# Patient Record
Sex: Female | Born: 1945 | Hispanic: No | Marital: Single
Health system: Southern US, Community
[De-identification: ages and names within clinical notes are randomized; demographics above are authoritative.]

## PROBLEM LIST (undated history)

## (undated) DIAGNOSIS — F419 Anxiety disorder, unspecified: Secondary | ICD-10-CM

## (undated) DIAGNOSIS — N12 Tubulo-interstitial nephritis, not specified as acute or chronic: Secondary | ICD-10-CM

## (undated) DIAGNOSIS — D219 Benign neoplasm of connective and other soft tissue, unspecified: Secondary | ICD-10-CM

## (undated) DIAGNOSIS — K5900 Constipation, unspecified: Secondary | ICD-10-CM

## (undated) DIAGNOSIS — B999 Unspecified infectious disease: Secondary | ICD-10-CM

## (undated) DIAGNOSIS — T148XXA Other injury of unspecified body region, initial encounter: Secondary | ICD-10-CM

## (undated) HISTORY — DX: Hypercalcemia: E83.52

## (undated) HISTORY — PX: PARATHYROIDECTOMY: SHX19

---

## 1998-07-09 ENCOUNTER — Ambulatory Visit (HOSPITAL_COMMUNITY): Admission: RE | Admit: 1998-07-09 | Discharge: 1998-07-09 | Payer: Self-pay | Admitting: *Deleted

## 1999-08-01 ENCOUNTER — Encounter (INDEPENDENT_AMBULATORY_CARE_PROVIDER_SITE_OTHER): Payer: Self-pay

## 1999-08-01 ENCOUNTER — Ambulatory Visit (HOSPITAL_COMMUNITY): Admission: RE | Admit: 1999-08-01 | Discharge: 1999-08-01 | Payer: Self-pay | Admitting: Obstetrics and Gynecology

## 2001-03-16 ENCOUNTER — Encounter: Payer: Self-pay | Admitting: Family Medicine

## 2001-03-16 ENCOUNTER — Ambulatory Visit (HOSPITAL_COMMUNITY): Admission: RE | Admit: 2001-03-16 | Discharge: 2001-03-16 | Payer: Self-pay | Admitting: Family Medicine

## 2002-09-20 ENCOUNTER — Encounter: Payer: Self-pay | Admitting: Internal Medicine

## 2002-09-20 ENCOUNTER — Ambulatory Visit (HOSPITAL_COMMUNITY): Admission: RE | Admit: 2002-09-20 | Discharge: 2002-09-20 | Payer: Self-pay | Admitting: Internal Medicine

## 2002-12-26 ENCOUNTER — Ambulatory Visit (HOSPITAL_COMMUNITY): Admission: RE | Admit: 2002-12-26 | Discharge: 2002-12-26 | Payer: Self-pay

## 2003-01-25 ENCOUNTER — Ambulatory Visit (HOSPITAL_COMMUNITY): Admission: RE | Admit: 2003-01-25 | Discharge: 2003-01-26 | Payer: Self-pay

## 2003-01-25 ENCOUNTER — Encounter (INDEPENDENT_AMBULATORY_CARE_PROVIDER_SITE_OTHER): Payer: Self-pay | Admitting: *Deleted

## 2003-05-23 ENCOUNTER — Ambulatory Visit (HOSPITAL_COMMUNITY): Admission: RE | Admit: 2003-05-23 | Discharge: 2003-05-23 | Payer: Self-pay

## 2003-07-06 ENCOUNTER — Ambulatory Visit (HOSPITAL_COMMUNITY): Admission: RE | Admit: 2003-07-06 | Discharge: 2003-07-06 | Payer: Self-pay | Admitting: Internal Medicine

## 2004-06-14 ENCOUNTER — Ambulatory Visit: Payer: Self-pay | Admitting: Family Medicine

## 2004-06-18 ENCOUNTER — Ambulatory Visit: Payer: Self-pay | Admitting: *Deleted

## 2004-12-30 ENCOUNTER — Ambulatory Visit: Payer: Self-pay | Admitting: Internal Medicine

## 2004-12-30 ENCOUNTER — Ambulatory Visit: Payer: Self-pay | Admitting: Family Medicine

## 2005-02-17 ENCOUNTER — Ambulatory Visit: Payer: Self-pay | Admitting: Family Medicine

## 2005-02-21 ENCOUNTER — Ambulatory Visit (HOSPITAL_COMMUNITY): Admission: RE | Admit: 2005-02-21 | Discharge: 2005-02-21 | Payer: Self-pay | Admitting: Family Medicine

## 2005-06-30 ENCOUNTER — Ambulatory Visit: Payer: Self-pay | Admitting: Nurse Practitioner

## 2005-07-14 ENCOUNTER — Ambulatory Visit: Payer: Self-pay | Admitting: Family Medicine

## 2006-01-29 ENCOUNTER — Ambulatory Visit: Payer: Self-pay | Admitting: Family Medicine

## 2006-02-02 ENCOUNTER — Ambulatory Visit (HOSPITAL_COMMUNITY): Admission: RE | Admit: 2006-02-02 | Discharge: 2006-02-02 | Payer: Self-pay | Admitting: Family Medicine

## 2006-02-25 ENCOUNTER — Ambulatory Visit (HOSPITAL_COMMUNITY): Admission: RE | Admit: 2006-02-25 | Discharge: 2006-02-25 | Payer: Self-pay | Admitting: Family Medicine

## 2006-02-25 ENCOUNTER — Ambulatory Visit: Payer: Self-pay | Admitting: Family Medicine

## 2006-03-23 ENCOUNTER — Ambulatory Visit: Payer: Self-pay | Admitting: Family Medicine

## 2006-03-26 ENCOUNTER — Ambulatory Visit (HOSPITAL_COMMUNITY): Admission: RE | Admit: 2006-03-26 | Discharge: 2006-03-26 | Payer: Self-pay | Admitting: Family Medicine

## 2006-04-06 ENCOUNTER — Ambulatory Visit: Payer: Self-pay | Admitting: Family Medicine

## 2006-05-05 ENCOUNTER — Ambulatory Visit: Payer: Self-pay | Admitting: Family Medicine

## 2006-05-11 ENCOUNTER — Ambulatory Visit: Payer: Self-pay | Admitting: Family Medicine

## 2006-06-02 ENCOUNTER — Ambulatory Visit: Payer: Self-pay | Admitting: Family Medicine

## 2006-06-30 ENCOUNTER — Ambulatory Visit: Payer: Self-pay | Admitting: Family Medicine

## 2006-07-31 ENCOUNTER — Ambulatory Visit: Payer: Self-pay | Admitting: Family Medicine

## 2006-11-06 ENCOUNTER — Ambulatory Visit: Payer: Self-pay | Admitting: Internal Medicine

## 2006-12-08 ENCOUNTER — Ambulatory Visit (HOSPITAL_COMMUNITY): Admission: RE | Admit: 2006-12-08 | Discharge: 2006-12-08 | Payer: Self-pay | Admitting: Family Medicine

## 2006-12-08 ENCOUNTER — Ambulatory Visit: Payer: Self-pay | Admitting: Family Medicine

## 2007-02-05 ENCOUNTER — Ambulatory Visit: Payer: Self-pay | Admitting: Family Medicine

## 2007-03-01 ENCOUNTER — Ambulatory Visit: Payer: Self-pay | Admitting: Family Medicine

## 2007-03-05 ENCOUNTER — Ambulatory Visit: Payer: Self-pay | Admitting: Family Medicine

## 2007-04-08 ENCOUNTER — Ambulatory Visit: Payer: Self-pay | Admitting: Family Medicine

## 2007-04-25 ENCOUNTER — Emergency Department (HOSPITAL_COMMUNITY): Admission: EM | Admit: 2007-04-25 | Discharge: 2007-04-25 | Payer: Self-pay | Admitting: Emergency Medicine

## 2007-06-16 ENCOUNTER — Encounter (INDEPENDENT_AMBULATORY_CARE_PROVIDER_SITE_OTHER): Payer: Self-pay | Admitting: *Deleted

## 2007-07-12 ENCOUNTER — Ambulatory Visit: Payer: Self-pay | Admitting: Family Medicine

## 2007-07-12 ENCOUNTER — Encounter (INDEPENDENT_AMBULATORY_CARE_PROVIDER_SITE_OTHER): Payer: Self-pay | Admitting: Family Medicine

## 2007-09-27 ENCOUNTER — Ambulatory Visit: Payer: Self-pay | Admitting: Family Medicine

## 2007-11-16 ENCOUNTER — Ambulatory Visit: Payer: Self-pay | Admitting: Family Medicine

## 2008-03-14 ENCOUNTER — Ambulatory Visit: Payer: Self-pay | Admitting: Internal Medicine

## 2008-04-13 ENCOUNTER — Ambulatory Visit: Payer: Self-pay | Admitting: Family Medicine

## 2008-05-10 ENCOUNTER — Ambulatory Visit: Payer: Self-pay | Admitting: Family Medicine

## 2008-05-10 LAB — CONVERTED CEMR LAB
ALT: 13 U/L (ref 0–35)
AST: 17 U/L (ref 0–37)
Albumin: 4.5 g/dL (ref 3.5–5.2)
Alkaline Phosphatase: 95 U/L (ref 39–117)
BUN: 12 mg/dL (ref 6–23)
CO2: 24 meq/L (ref 19–32)
Calcium: 11 mg/dL — ABNORMAL HIGH (ref 8.4–10.5)
Chloride: 108 meq/L (ref 96–112)
Cholesterol: 194 mg/dL (ref 0–200)
Creatinine, Ser: 0.73 mg/dL (ref 0.40–1.20)
Glucose, Bld: 101 mg/dL — ABNORMAL HIGH (ref 70–99)
HDL: 59 mg/dL (ref >39)
LDL Cholesterol: 122 mg/dL — ABNORMAL HIGH (ref 0–99)
Potassium: 4.5 meq/L (ref 3.5–5.3)
Sodium: 142 meq/L (ref 135–145)
Total Bilirubin: 0.4 mg/dL (ref 0.3–1.2)
Total CHOL/HDL Ratio: 3.3
Total Protein: 7.4 g/dL (ref 6.0–8.3)
Triglycerides: 63 mg/dL (ref <150)
VLDL: 13 mg/dL (ref 0–40)

## 2008-07-14 ENCOUNTER — Ambulatory Visit: Payer: Self-pay | Admitting: Family Medicine

## 2008-07-14 ENCOUNTER — Encounter (INDEPENDENT_AMBULATORY_CARE_PROVIDER_SITE_OTHER): Payer: Self-pay | Admitting: Family Medicine

## 2008-07-31 ENCOUNTER — Ambulatory Visit (HOSPITAL_COMMUNITY): Admission: RE | Admit: 2008-07-31 | Discharge: 2008-07-31 | Payer: Self-pay | Admitting: Family Medicine

## 2008-08-03 ENCOUNTER — Ambulatory Visit: Payer: Self-pay | Admitting: Family Medicine

## 2008-08-03 LAB — CONVERTED CEMR LAB
BUN: 7 mg/dL (ref 6–23)
CO2: 25 meq/L (ref 19–32)
Calcium: 11.7 mg/dL — ABNORMAL HIGH (ref 8.4–10.5)
Chloride: 106 meq/L (ref 96–112)
Creatinine, Ser: 0.77 mg/dL (ref 0.40–1.20)
Glucose, Bld: 103 mg/dL — ABNORMAL HIGH (ref 70–99)
Potassium: 4.8 meq/L (ref 3.5–5.3)
Sodium: 142 meq/L (ref 135–145)
TSH: 1.406 u[IU]/mL (ref 0.350–4.50)
Vit D, 1,25-Dihydroxy: 11 — ABNORMAL LOW (ref 30–89)

## 2008-10-05 ENCOUNTER — Ambulatory Visit: Payer: Self-pay | Admitting: Family Medicine

## 2008-11-20 ENCOUNTER — Ambulatory Visit: Payer: Self-pay | Admitting: Family Medicine

## 2008-11-21 ENCOUNTER — Ambulatory Visit: Payer: Self-pay | Admitting: Family Medicine

## 2008-11-21 LAB — CONVERTED CEMR LAB
BUN: 9 mg/dL (ref 6–23)
CO2: 25 meq/L (ref 19–32)
Calcium: 11.3 mg/dL — ABNORMAL HIGH (ref 8.4–10.5)
Chloride: 104 meq/L (ref 96–112)
Creatinine, Ser: 0.61 mg/dL (ref 0.40–1.20)
Glucose, Bld: 109 mg/dL — ABNORMAL HIGH (ref 70–99)
Potassium: 4.3 meq/L (ref 3.5–5.3)
Sodium: 140 meq/L (ref 135–145)
Vit D, 25-Hydroxy: 29 ng/mL — ABNORMAL LOW (ref 30–89)

## 2008-12-08 ENCOUNTER — Ambulatory Visit: Payer: Self-pay | Admitting: Family Medicine

## 2009-05-04 ENCOUNTER — Ambulatory Visit: Payer: Self-pay | Admitting: Family Medicine

## 2009-05-04 ENCOUNTER — Encounter (INDEPENDENT_AMBULATORY_CARE_PROVIDER_SITE_OTHER): Payer: Self-pay | Admitting: Family Medicine

## 2009-05-04 ENCOUNTER — Other Ambulatory Visit: Admission: RE | Admit: 2009-05-04 | Discharge: 2009-05-04 | Payer: Self-pay | Admitting: Family Medicine

## 2009-05-04 LAB — CONVERTED CEMR LAB
BUN: 12 mg/dL (ref 6–23)
CO2: 25 meq/L (ref 19–32)
Calcium: 11.4 mg/dL — ABNORMAL HIGH (ref 8.4–10.5)
Chloride: 107 meq/L (ref 96–112)
Creatinine, Ser: 0.75 mg/dL (ref 0.40–1.20)
Glucose, Bld: 98 mg/dL (ref 70–99)
Potassium: 4.5 meq/L (ref 3.5–5.3)
Sodium: 141 meq/L (ref 135–145)
Vitamin B-12: 1064 pg/mL — ABNORMAL HIGH (ref 211–911)

## 2009-05-21 ENCOUNTER — Other Ambulatory Visit: Admission: RE | Admit: 2009-05-21 | Discharge: 2009-05-21 | Payer: Self-pay | Admitting: Family Medicine

## 2009-05-21 ENCOUNTER — Encounter (INDEPENDENT_AMBULATORY_CARE_PROVIDER_SITE_OTHER): Payer: Self-pay | Admitting: Family Medicine

## 2009-05-21 ENCOUNTER — Ambulatory Visit: Payer: Self-pay | Admitting: Family Medicine

## 2009-08-01 ENCOUNTER — Ambulatory Visit (HOSPITAL_COMMUNITY): Admission: RE | Admit: 2009-08-01 | Discharge: 2009-08-01 | Payer: Self-pay | Admitting: Family Medicine

## 2009-09-18 ENCOUNTER — Ambulatory Visit: Payer: Self-pay | Admitting: Internal Medicine

## 2009-10-08 ENCOUNTER — Ambulatory Visit: Payer: Self-pay | Admitting: Internal Medicine

## 2010-05-31 ENCOUNTER — Ambulatory Visit: Payer: Self-pay | Admitting: Internal Medicine

## 2010-05-31 LAB — CONVERTED CEMR LAB
Cholesterol: 185 mg/dL (ref 0–200)
Direct LDL: 119 mg/dL — ABNORMAL HIGH
HDL: 61 mg/dL (ref >39)
LDL Cholesterol: 103 mg/dL — ABNORMAL HIGH (ref 0–99)
Total CHOL/HDL Ratio: 3
Triglycerides: 105 mg/dL (ref <150)
VLDL: 21 mg/dL (ref 0–40)

## 2010-06-05 ENCOUNTER — Ambulatory Visit (HOSPITAL_COMMUNITY): Admission: RE | Admit: 2010-06-05 | Discharge: 2010-06-05 | Payer: Self-pay | Admitting: Family Medicine

## 2010-07-12 ENCOUNTER — Ambulatory Visit: Payer: Self-pay | Admitting: Internal Medicine

## 2010-08-09 ENCOUNTER — Ambulatory Visit (HOSPITAL_COMMUNITY)
Admission: RE | Admit: 2010-08-09 | Discharge: 2010-08-09 | Payer: Self-pay | Source: Home / Self Care | Admitting: Internal Medicine

## 2010-09-24 ENCOUNTER — Encounter (INDEPENDENT_AMBULATORY_CARE_PROVIDER_SITE_OTHER): Payer: Self-pay | Admitting: Family Medicine

## 2010-09-24 LAB — CONVERTED CEMR LAB
ALT: 9 U/L (ref 0–35)
AST: 17 U/L (ref 0–37)
Albumin: 4.7 g/dL (ref 3.5–5.2)
Alkaline Phosphatase: 106 U/L (ref 39–117)
BUN: 10 mg/dL (ref 6–23)
CO2: 24 meq/L (ref 19–32)
Calcium, Total (PTH): 11.3 mg/dL — ABNORMAL HIGH (ref 8.4–10.5)
Calcium: 11.3 mg/dL — ABNORMAL HIGH (ref 8.4–10.5)
Chloride: 106 meq/L (ref 96–112)
Creatinine, Ser: 0.59 mg/dL (ref 0.40–1.20)
Glucose, Bld: 95 mg/dL (ref 70–99)
PTH: 198.6 pg/mL — ABNORMAL HIGH (ref 14.0–72.0)
Potassium: 4.6 meq/L (ref 3.5–5.3)
Sodium: 142 meq/L (ref 135–145)
Total Bilirubin: 0.4 mg/dL (ref 0.3–1.2)
Total Protein: 7.7 g/dL (ref 6.0–8.3)

## 2010-10-20 ENCOUNTER — Encounter: Payer: Self-pay | Admitting: Family Medicine

## 2010-10-29 NOTE — Miscellaneous (Signed)
 Summary: VIP  Patient: Amy Craig Note: All result statuses are Final unless otherwise noted.  Tests: (1) VIP (Medications)   LLIMPORTMEDS              Result Below...       RESULT: PROTONIX TBEC 40 MG*TOMAR UNA TABLETA POR VIA ORAL CADA DIA (TAKE 1 TABLET BY MOUTH ONCE A DAY)*07/31/2006*Last Refill: Lwxwntw*[id number]*******   LLIMPORTMEDS              Result Below...       RESULT: PREMPRO TABS 0.625-5 MG*TOMAR UNA TABLETA DIARIA (TAKE ONE TABLET ONCE A DAY)*03/16/2007*Last Refill: Lwxwntw*[id number]*******   LLIMPORTMEDS              Result Below...       RESULT: PHENAZOPYRIDINE HCL TABS 100 MG*TOMAR 2 TABLETAS 3 VECES AL DIA CON LAS COMIDAS) (TAKE 2 TABLETS 3 TIMES A DAY WITH MEALS)*03/01/2007*Last Refill: [email or web address]*******   LLIMPORTMEDS              Result Below...       RESULT: HYDROCORTISONE VALERATE CREA 0.2 %*APLICAR AL AREA AFECTADA UNA VEZ AL DIA (APPLY TO AFFECTED AREA ONCE DAILY)*06/30/2006*Last Refill: [email or web address]*******   LLIMPORTMEDS              Result Below...       RESULT: FUROSEMIDE TABS 40 MG*TOMA MITAD PASTILLA CADA MANANA (TAKE 1/2 TABLET EACH MORNING)*03/16/2007*Last Refill: [email or web address]*******   LLIMPORTMEDS              Result Below...       RESULT: CIPROFLOXACIN HCL TABS 250 MG*TOMAR UNA TABLETA DOS VECES AL DIA (TAKE ONE TABLET TWO TIMES A DAY).*12/08/2006*Last Refill: Lwxwntw*[id number]*******   LLIMPORTALLS              NKDA***  Note: An exclamation mark (!) indicates a result that was not dispersed into the flowsheet. Document Creation Date: 07/29/2007 3:05 PM _______________________________________________________________________  (1) Order result status: Final Collection or observation date-time: 06/16/2007 Requested date-time: 06/16/2007 Receipt date-time:  Reported date-time: 06/16/2007 Referring Physician:   Ordering Physician:   Specimen Source:  Source: Amy Craig Order Number:  Lab site:

## 2010-11-28 ENCOUNTER — Other Ambulatory Visit: Payer: Self-pay | Admitting: Surgery

## 2010-11-28 ENCOUNTER — Other Ambulatory Visit (HOSPITAL_COMMUNITY): Payer: Self-pay | Admitting: Surgery

## 2010-11-28 ENCOUNTER — Encounter (HOSPITAL_COMMUNITY): Payer: Self-pay

## 2010-11-28 ENCOUNTER — Ambulatory Visit (HOSPITAL_COMMUNITY)
Admission: RE | Admit: 2010-11-28 | Discharge: 2010-11-28 | Disposition: A | Discharge status: Home / Self Care | Payer: Self-pay | Source: Ambulatory Visit | Attending: Surgery | Admitting: Surgery

## 2010-11-28 DIAGNOSIS — Z01810 Encounter for preprocedural cardiovascular examination: Secondary | ICD-10-CM | POA: Insufficient documentation

## 2010-11-28 DIAGNOSIS — Z01812 Encounter for preprocedural laboratory examination: Secondary | ICD-10-CM | POA: Insufficient documentation

## 2010-11-28 DIAGNOSIS — M479 Spondylosis, unspecified: Secondary | ICD-10-CM | POA: Insufficient documentation

## 2010-11-28 DIAGNOSIS — R9431 Abnormal electrocardiogram [ECG] [EKG]: Secondary | ICD-10-CM | POA: Insufficient documentation

## 2010-11-28 DIAGNOSIS — E210 Primary hyperparathyroidism: Secondary | ICD-10-CM | POA: Insufficient documentation

## 2010-11-28 DIAGNOSIS — Z01818 Encounter for other preprocedural examination: Secondary | ICD-10-CM | POA: Insufficient documentation

## 2010-11-28 LAB — DIFFERENTIAL
Basophils Absolute: 0 K/uL (ref 0.0–0.1)
Basophils Relative: 1 % (ref 0–1)
Eosinophils Absolute: 0.1 K/uL (ref 0.0–0.7)
Eosinophils Relative: 2 % (ref 0–5)
Lymphocytes Relative: 42 % (ref 12–46)
Lymphs Abs: 2.5 K/uL (ref 0.7–4.0)
Monocytes Absolute: 0.5 K/uL (ref 0.1–1.0)
Monocytes Relative: 8 % (ref 3–12)
Neutro Abs: 2.8 K/uL (ref 1.7–7.7)
Neutrophils Relative %: 47 % (ref 43–77)

## 2010-11-28 LAB — CBC
HCT: 42 % (ref 36.0–46.0)
Hemoglobin: 13.8 g/dL (ref 12.0–15.0)
MCH: 30.5 pg (ref 26.0–34.0)
MCHC: 32.9 g/dL (ref 30.0–36.0)
MCV: 92.7 fL (ref 78.0–100.0)
Platelets: 299 K/uL (ref 150–400)
RBC: 4.53 MIL/uL (ref 3.87–5.11)
RDW: 12.6 % (ref 11.5–15.5)
WBC: 5.9 K/uL (ref 4.0–10.5)

## 2010-11-28 LAB — URINALYSIS, ROUTINE W REFLEX MICROSCOPIC
Bilirubin Urine: NEGATIVE
Ketones, ur: NEGATIVE mg/dL
Nitrite: POSITIVE — AB
Protein, ur: NEGATIVE mg/dL
Specific Gravity, Urine: 1.024 (ref 1.005–1.030)
Urine Glucose, Fasting: NEGATIVE mg/dL
Urobilinogen, UA: 0.2 mg/dL (ref 0.0–1.0)
pH: 6 (ref 5.0–8.0)

## 2010-11-28 LAB — SURGICAL PCR SCREEN
MRSA, PCR: NEGATIVE
Staphylococcus aureus: NEGATIVE

## 2010-11-28 LAB — URINE MICROSCOPIC-ADD ON

## 2010-11-28 LAB — BASIC METABOLIC PANEL WITH GFR
BUN: 9 mg/dL (ref 6–23)
CO2: 28 meq/L (ref 19–32)
Calcium: 11.4 mg/dL — ABNORMAL HIGH (ref 8.4–10.5)
Chloride: 109 meq/L (ref 96–112)
Creatinine, Ser: 0.61 mg/dL (ref 0.4–1.2)
GFR calc Af Amer: >60 mL/min (ref >60)
GFR calc non Af Amer: >60 mL/min (ref >60)
Glucose, Bld: 107 mg/dL — ABNORMAL HIGH (ref 70–99)
Potassium: 4.2 meq/L (ref 3.5–5.1)
Sodium: 142 meq/L (ref 135–145)

## 2010-11-28 LAB — PROTIME-INR
INR: 1.05 (ref 0.00–1.49)
Prothrombin Time: 13.9 s (ref 11.6–15.2)

## 2010-12-03 ENCOUNTER — Ambulatory Visit (HOSPITAL_COMMUNITY)
Admission: RE | Admit: 2010-12-03 | Discharge: 2010-12-04 | Disposition: A | Discharge status: Home / Self Care | Payer: Self-pay | Source: Ambulatory Visit | Attending: Surgery | Admitting: Surgery

## 2010-12-03 ENCOUNTER — Other Ambulatory Visit: Payer: Self-pay | Admitting: Surgery

## 2010-12-03 DIAGNOSIS — Z01818 Encounter for other preprocedural examination: Secondary | ICD-10-CM | POA: Insufficient documentation

## 2010-12-03 DIAGNOSIS — Z01810 Encounter for preprocedural cardiovascular examination: Secondary | ICD-10-CM | POA: Insufficient documentation

## 2010-12-03 DIAGNOSIS — Z01812 Encounter for preprocedural laboratory examination: Secondary | ICD-10-CM | POA: Insufficient documentation

## 2010-12-03 DIAGNOSIS — E210 Primary hyperparathyroidism: Secondary | ICD-10-CM | POA: Insufficient documentation

## 2010-12-03 DIAGNOSIS — R9431 Abnormal electrocardiogram [ECG] [EKG]: Secondary | ICD-10-CM | POA: Insufficient documentation

## 2010-12-03 LAB — POCT I-STAT, CHEM 8
BUN: <3 mg/dL — ABNORMAL LOW (ref 6–23)
Calcium, Ion: 1.5 mmol/L — ABNORMAL HIGH (ref 1.12–1.32)
Chloride: 104 meq/L (ref 96–112)
Creatinine, Ser: 0.7 mg/dL (ref 0.4–1.2)
Glucose, Bld: 99 mg/dL (ref 70–99)
HCT: 43 % (ref 36.0–46.0)
Hemoglobin: 14.6 g/dL (ref 12.0–15.0)
Potassium: 4.8 meq/L (ref 3.5–5.1)
Sodium: 141 meq/L (ref 135–145)
TCO2: 27 mmol/L (ref 0–100)

## 2010-12-03 LAB — CALCIUM: Calcium: 9.2 mg/dL (ref 8.4–10.5)

## 2010-12-04 LAB — CALCIUM: Calcium: 8.7 mg/dL (ref 8.4–10.5)

## 2010-12-06 NOTE — Discharge Summary (Signed)
  Amy Craig, Amy Craig      ACCOUNT NO.:  [id number]  MEDICAL RECORD NO.:  [id number]           PATIENT TYPE:  O  LOCATION:  1538                         FACILITY:  [facility]  PHYSICIAN:  Velora Heckler, MD      DATE OF BIRTH:  May 29, 1946  DATE OF ADMISSION:  12/03/2010 DATE OF DISCHARGE:  12/04/2010                              DISCHARGE SUMMARY   REASON FOR ADMISSION:  Recurrent primary hyperparathyroidism.  BRIEF HISTORY:  The patient is a 65 year old Hispanic female with recurrent primary hyperparathyroidism.  The patient had undergone minimally invasive parathyroidectomy by Dr. Lebron Conners in 2004. Unfortunately, her calcium levels never normalized postoperatively.  She has been followed intermittently in [place] and in [place], [place].  Recent studies in 2011 in [place], [place] showed a persistently elevated PTH level and elevated serum calcium level. 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  The patient is now admitted for resection.  HOSPITAL COURSE:  The patient was admitted on the surgical service and taken directly to the operating room.  She underwent right neck exploration and resection of a right superior parathyroid adenoma. Postoperative course was uncomplicated.  Calcium levels fell to 9.2 on the evening of surgery and to 8.7 on the morning following her procedure.  She tolerated a diet.  She was prepared for discharge.  DISCHARGE PLAN:  The patient is discharged home today, December 04, 2010, in good condition, tolerating a regular diet, and ambulating independently. The patient will be seen back in my office at [facility] in 2 weeks.  Discharge medications include Vicodin as needed for pain.  FINAL DIAGNOSIS:  Recurrent primary hyperparathyroidism, final pathologic results pending at the time of discharge.  CONDITION ON DISCHARGE:  Good.     Velora Heckler, MD     TMG/MEDQ  D:   12/04/2010  T:  12/04/2010  Job:  [id number]  cc:   Velora Heckler, MD [address] [place] [address]  Sharlyne Pacas, M.D. [facility]  Electronically Signed by Darnell Level MD on 12/05/2010 11:42:12 AM

## 2010-12-06 NOTE — Op Note (Signed)
Amy Craig, Amy Craig      ACCOUNT NO.:  [id number]  MEDICAL RECORD NO.:  [id number]           PATIENT TYPE:  O  LOCATION:  DAYL                         FACILITY:  [facility]  PHYSICIAN:  Velora Heckler, MD      DATE OF BIRTH:  12-26-1945  DATE OF PROCEDURE:  12/03/2010 DATE OF DISCHARGE:                              OPERATIVE REPORT   PREOPERATIVE DIAGNOSIS:  Recurrent primary hyperparathyroidism.  POSTOPERATIVE DIAGNOSIS:  Recurrent primary hyperparathyroidism.  PROCEDURE:  Right superior minimally invasive parathyroidectomy.  SURGEON:  Velora Heckler, MD, FACS  ANESTHESIA:  General per Jill Side, M.D.  ESTIMATED BLOOD LOSS:  Minimal.  PREPARATION:  ChloraPrep.  COMPLICATIONS:  None.  INDICATIONS:  The patient is a 65 year old Hispanic female who presents with recurrent primary hyperparathyroidism.  The patient had undergone minimally invasive parathyroidectomy by Dr. Lebron Conners in 2004. However, calcium levels failed to normalize.  The patient has moved between [place] and [place], [place].  In [place], [place], she was noted to have persistently elevated serum calcium level and an elevated intact PTH level of 135.  She underwent a nuclear medicine sestamibi scan in May 2011 localizing a parathyroid adenoma to the right neck posterior to the right lobe of the thyroid gland consistent with right superior parathyroid adenoma.  The patient now comes to surgery for neck exploration and resection.  BODY OF REPORT:  Procedure was done in OR #6 at the [facility].  The patient was brought to the operating room, placed in a supine position on the operating room table.  Following administration of general anesthesia, the patient was positioned and then prepped and draped in the usual strict aseptic fashion.  After ascertaining that an adequate level of anesthesia had been achieved, a mid right neck incision was made with #15 blade.   Dissection was carried through subcutaneous tissues and platysma.  Weitlaner retractor was placed for exposure.  Strap muscles were incised in the midline and reflected to the right exposing the right thyroid lobe.  There was some scar tissue due to previous dissection.  Right lobe was gently mobilized.  Dissection was carried posterior to the right thyroid lobe. Superior pole vessels were left intact.  Branches of the inferior thyroid artery were divided between small Ligaclips with the electrocautery.  Posterior to the right lobe was a nodular density lying alongside the lateral edge of the esophagus.  This was gently mobilized. Dissection was difficult due to scar tissue.  Vascular structures were divided between small Ligaclips.  Nodule was completely mobilized and finally excised in its entirety.  It was submitted fresh to Pathology where frozen section by Dr. Jimmy Picket confirmed parathyroid tissue consistent with parathyroid adenoma.  Neck was irrigated with saline.  Good hemostasis was noted.  Surgicel was placed in the operative field.  Strap muscles were reapproximated in the midline with interrupted 3-0 Vicryl sutures.  Platysma was closed with interrupted 3-0 Vicryl sutures.  Skin was closed with running 4-0 Monocryl subcuticular suture.  Wound was washed and dried and Benzoin and Steri-Strips were applied.  Sterile dressings were applied.  The patient was awakened from anesthesia and brought to the  recovery room. The patient tolerated the procedure well.   Velora Heckler, MD, FACS     TMG/MEDQ  D:  12/03/2010  T:  12/03/2010  Job:  [id number]  cc:   Dr. Sharlyne Pacas  Electronically Signed by Darnell Level MD on 12/05/2010 11:42:07 AM

## 2010-12-07 ENCOUNTER — Emergency Department (HOSPITAL_COMMUNITY)
Admission: EM | Admit: 2010-12-07 | Discharge: 2010-12-07 | Disposition: A | Discharge status: Home / Self Care | Payer: Self-pay | Attending: Emergency Medicine | Admitting: Emergency Medicine

## 2010-12-07 DIAGNOSIS — X58XXXA Exposure to other specified factors, initial encounter: Secondary | ICD-10-CM | POA: Insufficient documentation

## 2010-12-07 DIAGNOSIS — E215 Disorder of parathyroid gland, unspecified: Secondary | ICD-10-CM | POA: Insufficient documentation

## 2010-12-07 DIAGNOSIS — T148XXA Other injury of unspecified body region, initial encounter: Secondary | ICD-10-CM | POA: Insufficient documentation

## 2010-12-07 DIAGNOSIS — M542 Cervicalgia: Secondary | ICD-10-CM | POA: Insufficient documentation

## 2011-02-14 NOTE — Op Note (Signed)
   [name], Amy Craig                          ACCOUNT NO.:  [id number]   MEDICAL RECORD NO.:  [id number]                   PATIENT TYPE:  OIB   LOCATION:  2864                                 FACILITY:  [facility]   PHYSICIAN:  Lorre Munroe., M.D.            DATE OF BIRTH:  Jan 18, 1946   DATE OF PROCEDURE:  01/25/2003  DATE OF DISCHARGE:                                 OPERATIVE REPORT   PREOPERATIVE DIAGNOSIS:  Right inferior parathyroid adenoma.   POSTOPERATIVE DIAGNOSIS:  Right inferior parathyroid adenoma.   OPERATION/PROCEDURE:  Minimally invasive resection of parathyroid adenoma.   SURGEON:  Lebron Conners, M.D.   ASSISTANT:  Vikki Ports, M.D.   ANESTHESIA:  General.   DESCRIPTION OF PROCEDURE:  After the patient was monitored and anesthetized,  I used the probe to locate a hot spot near the right inferior part of the  thyroid gland as expected because of the Sestamibi scan.  I then prepped and  draped the neck as a sterile field and made a short transverse incision over  the area of maximum radioactivity and divided the platysma, got hemostasis,  retracted the sternocleidomastoid laterally, and divided the medial strap  muscles to expose the inferior pole of the parathyroid and the area around  it.  I dissected down around the inferior parathyroid artery.  No tumor was  immediately obvious.  I kept my dissection medial to the carotid and using  the maximal radioactivity as a guide, I located a mass behind the inferior  pole of  the thyroid gland.  It was very lobulated.  I excised it and sent  it for frozen section.  Then examining the tissue, felt that I had not  removed all of it perhaps.  Removed another tumor, which was in the exact  same location, slightly more posterior.  Frozen section diagnosis of  parathyroid tissue was returned on both specimens.  Hemostasis was obtained  with clips, suture ligatures, and cautery as necessary.  I briefly irrigated  the field and found hemostasis to be good.  I reapproximated the strap  muscles with interrupted figure-of-eight 4-0 Vicryl sutures, ran a 4-0  Vicryl suture in the platysma, ran another intracuticular 4-0 Vicryl suture  in the skin, and reinforced that with Steri-Strips.  The patient tolerated  the operation well.                                               Lorre Munroe., M.D.    Jodi Marble  D:  01/25/2003  T:  01/25/2003  Job:  [id number]

## 2011-03-17 ENCOUNTER — Encounter (INDEPENDENT_AMBULATORY_CARE_PROVIDER_SITE_OTHER): Payer: Self-pay | Admitting: Surgery

## 2011-03-17 DIAGNOSIS — Z803 Family history of malignant neoplasm of breast: Secondary | ICD-10-CM | POA: Insufficient documentation

## 2011-03-17 DIAGNOSIS — E079 Disorder of thyroid, unspecified: Secondary | ICD-10-CM | POA: Insufficient documentation

## 2011-03-17 DIAGNOSIS — M1990 Unspecified osteoarthritis, unspecified site: Secondary | ICD-10-CM | POA: Insufficient documentation

## 2011-03-31 ENCOUNTER — Other Ambulatory Visit: Payer: Self-pay | Admitting: Specialist

## 2011-03-31 ENCOUNTER — Ambulatory Visit
Admission: RE | Admit: 2011-03-31 | Discharge: 2011-03-31 | Disposition: A | Discharge status: Home / Self Care | Payer: No Typology Code available for payment source | Source: Ambulatory Visit | Attending: Specialist | Admitting: Specialist

## 2011-03-31 DIAGNOSIS — R6889 Other general symptoms and signs: Secondary | ICD-10-CM

## 2011-05-12 ENCOUNTER — Other Ambulatory Visit (HOSPITAL_COMMUNITY): Payer: Self-pay | Admitting: Chiropractic Medicine

## 2011-05-12 ENCOUNTER — Ambulatory Visit (HOSPITAL_COMMUNITY)
Admission: RE | Admit: 2011-05-12 | Discharge: 2011-05-12 | Disposition: A | Discharge status: Home / Self Care | Payer: Self-pay | Source: Ambulatory Visit | Attending: Chiropractic Medicine | Admitting: Chiropractic Medicine

## 2011-05-12 DIAGNOSIS — M545 Low back pain, unspecified: Secondary | ICD-10-CM | POA: Insufficient documentation

## 2011-05-12 DIAGNOSIS — M47814 Spondylosis without myelopathy or radiculopathy, thoracic region: Secondary | ICD-10-CM | POA: Insufficient documentation

## 2011-05-12 DIAGNOSIS — M25559 Pain in unspecified hip: Secondary | ICD-10-CM | POA: Insufficient documentation

## 2011-05-12 DIAGNOSIS — R52 Pain, unspecified: Secondary | ICD-10-CM

## 2011-05-12 DIAGNOSIS — M51379 Other intervertebral disc degeneration, lumbosacral region without mention of lumbar back pain or lower extremity pain: Secondary | ICD-10-CM | POA: Insufficient documentation

## 2011-05-12 DIAGNOSIS — M5137 Other intervertebral disc degeneration, lumbosacral region: Secondary | ICD-10-CM | POA: Insufficient documentation

## 2011-07-14 LAB — URINALYSIS, ROUTINE W REFLEX MICROSCOPIC
Bilirubin Urine: NEGATIVE
Glucose, UA: NEGATIVE
Hgb urine dipstick: NEGATIVE
Ketones, ur: 15 — AB
Nitrite: NEGATIVE
Protein, ur: NEGATIVE
Specific Gravity, Urine: 1.019
Urobilinogen, UA: 0.2
pH: 7

## 2011-07-14 LAB — COMPREHENSIVE METABOLIC PANEL WITH GFR
ALT: 12
AST: 16
Albumin: 4.1
Alkaline Phosphatase: 88
BUN: 6
CO2: 25
Calcium: 10.6 — ABNORMAL HIGH
Chloride: 107
Creatinine, Ser: 0.47
GFR calc Af Amer: >60
GFR calc non Af Amer: >60
Glucose, Bld: 111 — ABNORMAL HIGH
Potassium: 3.5
Sodium: 138
Total Bilirubin: 0.8
Total Protein: 6.9

## 2011-07-14 LAB — DIFFERENTIAL
Basophils Absolute: 0
Basophils Relative: 0
Eosinophils Absolute: 0
Eosinophils Relative: 0
Lymphocytes Relative: 17
Lymphs Abs: 1.4
Monocytes Absolute: 0.3
Monocytes Relative: 4
Neutro Abs: 6.6
Neutrophils Relative %: 79 — ABNORMAL HIGH

## 2011-07-14 LAB — CBC
HCT: 41.5
Hemoglobin: 13.9
MCHC: 33.6
MCV: 90.3
Platelets: 271
RBC: 4.59
RDW: 12.8
WBC: 8.4

## 2011-07-14 LAB — LIPASE, BLOOD: Lipase: 22

## 2011-09-04 ENCOUNTER — Other Ambulatory Visit: Payer: Self-pay | Admitting: Internal Medicine

## 2011-09-04 DIAGNOSIS — Z1231 Encounter for screening mammogram for malignant neoplasm of breast: Secondary | ICD-10-CM

## 2011-10-14 ENCOUNTER — Ambulatory Visit (HOSPITAL_COMMUNITY)
Admission: RE | Admit: 2011-10-14 | Discharge: 2011-10-14 | Disposition: A | Discharge status: Home / Self Care | Payer: Self-pay | Source: Ambulatory Visit | Attending: Internal Medicine | Admitting: Internal Medicine

## 2011-10-14 DIAGNOSIS — Z1231 Encounter for screening mammogram for malignant neoplasm of breast: Secondary | ICD-10-CM | POA: Insufficient documentation

## 2011-10-21 ENCOUNTER — Other Ambulatory Visit: Payer: Self-pay | Admitting: Internal Medicine

## 2011-10-21 DIAGNOSIS — R928 Other abnormal and inconclusive findings on diagnostic imaging of breast: Secondary | ICD-10-CM

## 2011-10-28 ENCOUNTER — Ambulatory Visit
Admission: RE | Admit: 2011-10-28 | Discharge: 2011-10-28 | Disposition: A | Discharge status: Home / Self Care | Payer: Self-pay | Source: Ambulatory Visit | Attending: Internal Medicine | Admitting: Internal Medicine

## 2011-10-28 DIAGNOSIS — R928 Other abnormal and inconclusive findings on diagnostic imaging of breast: Secondary | ICD-10-CM

## 2013-01-13 ENCOUNTER — Ambulatory Visit (INDEPENDENT_AMBULATORY_CARE_PROVIDER_SITE_OTHER): Payer: Self-pay | Admitting: Obstetrics & Gynecology

## 2013-01-13 ENCOUNTER — Encounter: Payer: Self-pay | Admitting: Obstetrics & Gynecology

## 2013-01-13 VITALS — BP 141/69 | HR 58 | Temp 97.0°F | Ht 60.0 in | Wt 146.4 lb

## 2013-01-13 DIAGNOSIS — N852 Hypertrophy of uterus: Secondary | ICD-10-CM

## 2013-01-13 DIAGNOSIS — Z01419 Encounter for gynecological examination (general) (routine) without abnormal findings: Secondary | ICD-10-CM

## 2013-01-13 DIAGNOSIS — Z0000 Encounter for general adult medical examination without abnormal findings: Secondary | ICD-10-CM

## 2013-01-13 NOTE — Progress Notes (Signed)
  Subjective:    Patient ID: Amy Craig, female    DOB: 05/28/1946, 67 y.o.   MRN: [id number]  HPI  67 yo Hispanic P7 who is here today because of chronic constipation. She was told that this is caused by an 8 cm uterine fibroid. This was based on an u/s done in [place]. She is abstinent. Her pap and mammogram are due.  Review of Systems     Objective:   Physical Exam  Normal breast exam  Vulva/vagina- with severe atrophy Cervix stenotic 12 week size uterus, non-palpable adnexa      Assessment & Plan:  Preventative care- pap and mammogram Enlarged uterus/probable fibroid- schedule gyn u/s

## 2013-01-25 ENCOUNTER — Ambulatory Visit (HOSPITAL_COMMUNITY)
Admission: RE | Admit: 2013-01-25 | Discharge: 2013-01-25 | Disposition: A | Discharge status: Home / Self Care | Payer: Self-pay | Source: Ambulatory Visit | Attending: Obstetrics & Gynecology | Admitting: Obstetrics & Gynecology

## 2013-01-25 ENCOUNTER — Ambulatory Visit (HOSPITAL_COMMUNITY): Payer: Self-pay

## 2013-01-25 DIAGNOSIS — N852 Hypertrophy of uterus: Secondary | ICD-10-CM | POA: Insufficient documentation

## 2013-01-25 DIAGNOSIS — Z780 Asymptomatic menopausal state: Secondary | ICD-10-CM | POA: Insufficient documentation

## 2013-01-25 DIAGNOSIS — D252 Subserosal leiomyoma of uterus: Secondary | ICD-10-CM | POA: Insufficient documentation

## 2013-02-03 ENCOUNTER — Other Ambulatory Visit: Payer: Self-pay | Admitting: Obstetrics & Gynecology

## 2013-02-03 ENCOUNTER — Encounter: Payer: Self-pay | Admitting: Obstetrics & Gynecology

## 2013-02-03 ENCOUNTER — Ambulatory Visit (INDEPENDENT_AMBULATORY_CARE_PROVIDER_SITE_OTHER): Payer: Self-pay | Admitting: Obstetrics & Gynecology

## 2013-02-03 ENCOUNTER — Ambulatory Visit (HOSPITAL_COMMUNITY)
Admission: RE | Admit: 2013-02-03 | Discharge: 2013-02-03 | Disposition: A | Discharge status: Home / Self Care | Payer: Self-pay | Source: Ambulatory Visit | Attending: Obstetrics & Gynecology | Admitting: Obstetrics & Gynecology

## 2013-02-03 VITALS — BP 134/60 | HR 55 | Temp 96.9°F | Ht 60.0 in | Wt 146.3 lb

## 2013-02-03 DIAGNOSIS — Z0000 Encounter for general adult medical examination without abnormal findings: Secondary | ICD-10-CM

## 2013-02-03 DIAGNOSIS — N852 Hypertrophy of uterus: Secondary | ICD-10-CM

## 2013-02-03 DIAGNOSIS — K5900 Constipation, unspecified: Secondary | ICD-10-CM

## 2013-02-03 NOTE — Progress Notes (Signed)
  Subjective:    Patient ID: Amy Craig, female    DOB: 11-03-1945, 67 y.o.   MRN: [id number]  HPI  67 yo S H P7 who is here for follow up after an u/s. Her major complaint is that of constipation for 10 years. She reports that she has a BM every 3-5 days. She takes flax seed daily. She has tried Miralax 1 time and "it did help".  Review of Systems     Objective:   Physical Exam  Mammogram normal Pap normal U/s shows fibroids      Assessment & Plan:  Constipation- Miralax daily If no relief, refer to GI I told her that if she wants a hysterectomy, then I would be happy to do it, but that I can't promise her that it would improve her bowel function.

## 2013-07-04 ENCOUNTER — Inpatient Hospital Stay (HOSPITAL_COMMUNITY)
Admission: AD | Admit: 2013-07-04 | Discharge: 2013-07-04 | Disposition: A | Discharge status: Home / Self Care | Payer: Self-pay | Source: Ambulatory Visit | Attending: Obstetrics & Gynecology | Admitting: Obstetrics & Gynecology

## 2013-07-04 ENCOUNTER — Encounter (HOSPITAL_COMMUNITY): Payer: Self-pay | Admitting: *Deleted

## 2013-07-04 DIAGNOSIS — K5900 Constipation, unspecified: Secondary | ICD-10-CM | POA: Insufficient documentation

## 2013-07-04 DIAGNOSIS — R300 Dysuria: Secondary | ICD-10-CM | POA: Insufficient documentation

## 2013-07-04 DIAGNOSIS — M79609 Pain in unspecified limb: Secondary | ICD-10-CM | POA: Insufficient documentation

## 2013-07-04 DIAGNOSIS — M25562 Pain in left knee: Secondary | ICD-10-CM

## 2013-07-04 DIAGNOSIS — N949 Unspecified condition associated with female genital organs and menstrual cycle: Secondary | ICD-10-CM | POA: Insufficient documentation

## 2013-07-04 DIAGNOSIS — B372 Candidiasis of skin and nail: Secondary | ICD-10-CM

## 2013-07-04 DIAGNOSIS — D259 Leiomyoma of uterus, unspecified: Secondary | ICD-10-CM | POA: Insufficient documentation

## 2013-07-04 DIAGNOSIS — M549 Dorsalgia, unspecified: Secondary | ICD-10-CM | POA: Insufficient documentation

## 2013-07-04 DIAGNOSIS — N950 Postmenopausal bleeding: Secondary | ICD-10-CM | POA: Insufficient documentation

## 2013-07-04 HISTORY — DX: Unspecified infectious disease: B99.9

## 2013-07-04 HISTORY — DX: Constipation, unspecified: K59.00

## 2013-07-04 HISTORY — DX: Benign neoplasm of connective and other soft tissue, unspecified: D21.9

## 2013-07-04 HISTORY — DX: Other injury of unspecified body region, initial encounter: T14.8XXA

## 2013-07-04 HISTORY — DX: Tubulo-interstitial nephritis, not specified as acute or chronic: N12

## 2013-07-04 HISTORY — DX: Anxiety disorder, unspecified: F41.9

## 2013-07-04 LAB — URINALYSIS, ROUTINE W REFLEX MICROSCOPIC
Bilirubin Urine: NEGATIVE
Glucose, UA: NEGATIVE mg/dL
Ketones, ur: NEGATIVE mg/dL
Nitrite: NEGATIVE
Protein, ur: NEGATIVE mg/dL
Specific Gravity, Urine: 1.02 (ref 1.005–1.030)
Urobilinogen, UA: 1 mg/dL (ref 0.0–1.0)
pH: 6 (ref 5.0–8.0)

## 2013-07-04 LAB — URINE MICROSCOPIC-ADD ON

## 2013-07-04 MED ORDER — TRIAMCINOLONE ACETONIDE 0.1 % EX CREA: TOPICAL_CREAM | Freq: Two times a day (BID) | CUTANEOUS | Status: AC

## 2013-07-04 MED ORDER — IBUPROFEN 800 MG PO TABS
800.0000 mg | ORAL_TABLET | Freq: Once | ORAL | Status: AC
  Administered 2013-07-04: 800 mg via ORAL
  Filled 2013-07-04: qty 1

## 2013-07-04 MED ORDER — CYCLOBENZAPRINE HCL 10 MG PO TABS
10.0000 mg | ORAL_TABLET | Freq: Once | ORAL | Status: AC
  Administered 2013-07-04: 10 mg via ORAL
  Filled 2013-07-04: qty 1

## 2013-07-04 MED ORDER — IBUPROFEN 800 MG PO TABS: 800.0000 mg | ORAL_TABLET | Freq: Three times a day (TID) | ORAL | Status: AC

## 2013-07-04 MED ORDER — NYSTATIN 100000 UNIT/GM EX CREA: TOPICAL_CREAM | CUTANEOUS | Status: AC

## 2013-07-04 MED ORDER — CYCLOBENZAPRINE HCL 10 MG PO TABS: 10.0000 mg | ORAL_TABLET | Freq: Two times a day (BID) | ORAL | Status: AC | PRN

## 2013-07-04 NOTE — MAU Note (Signed)
Hospital translator in for triage. Patient states she has been having pain with urination for 3-4 weeks and started having left flank pain today and pain in the left leg today. States she has had a brown vaginal discharge for a while and has taken a pill for it but it is still there.

## 2013-07-04 NOTE — MAU Note (Signed)
Started as pain with urination, burning/itching a few days ago.  Flank pain started yesterday, leg pain started last night

## 2013-07-04 NOTE — MAU Note (Signed)
aslo reports left breast is larger than right, noted change approx 2 yrs. Last mammogram was less than a year ago.

## 2013-07-04 NOTE — MAU Provider Note (Signed)
History     CSN: [id number]  Arrival date and time: 07/04/13 1541   First Provider Initiated Contact with Patient 07/04/13 1844      Chief Complaint  Patient presents with  . Dysuria  . Vaginal Discharge  . Leg Pain  . Flank Pain   HPI Ms. JEANNIFER DRAKEFORD is a 67 y.o. [id number] who presents to MAU today with multiple complaints. The patient state that she has had dysuria x 1 month, left lower back and left leg pain x months, irritation of the external genitalia x 2 months and a brown discharge x 6 months. The patient has known fibroids and has been evaluated in [facility] clinic previously. The patient states that she takes care of her MR daughter and grandchildren regularly and that the back pain is worse recently with ambulation and lifting. She denies unilateral swelling or bruising.   OB History   Grav Para Term Preterm Abortions TAB SAB Ect Mult Living   7 7 7       7       Past Medical History  Diagnosis Date  . Hypercalcemia   . Constipation   . Infection     UTI  . Pyelonephritis   . Fracture     rt ankle  . Fibroid   . Anxiety     with pregnancies    Past Surgical History  Procedure Laterality Date  . Parathyroidectomy      Ask patient to confirm. Detail not on history form dated 10/25/10.    Family History  Problem Relation Age of Onset  . Diabetes Father   . Cancer Brother     "head" noted on history form. Clarify w/patient.  . Alzheimer's disease Sister   . Diabetes Brother     History  Substance Use Topics  . Smoking status: Never Smoker   . Smokeless tobacco: Never Used  . Alcohol Use: Yes     Comment: rare    Allergies:  Allergies  Allergen Reactions  . Sulfa Antibiotics Rash    Confirm with patient if antibiotics or drug reactors she responds to.    Prescriptions prior to admission  Medication Sig Dispense Refill  . amitriptyline (ELAVIL) 25 MG tablet Take 25 mg by mouth as needed.        . Flaxseed, Linseed, (GNP FLAXSEED PO) Take 1  tablet by mouth daily.        Review of Systems  Constitutional: Negative for fever.  Gastrointestinal: Positive for abdominal pain and constipation. Negative for nausea, vomiting and diarrhea.  Genitourinary: Positive for dysuria. Negative for urgency, frequency and hematuria.       + vaginal bleeding Neg - vaginal discharge  Musculoskeletal: Positive for back pain and joint pain.   Physical Exam   Blood pressure 130/58, pulse 61, temperature 98.9 F (37.2 C), resp. rate 18, SpO2 100.00%.  Physical Exam  Constitutional: She is oriented to person, place, and time. She appears well-developed and well-nourished. No distress.  HENT:  Head: Normocephalic and atraumatic.  Cardiovascular: Normal rate, regular rhythm and normal heart sounds.   Respiratory: Effort normal and breath sounds normal. No respiratory distress.  GI: Soft. Bowel sounds are normal. She exhibits no distension and no mass. There is tenderness (mild tenderness to palpation of the LLQ). There is no rebound and no guarding.  Genitourinary: There is rash (mild erythema noted on the labia minora) on the right labia. There is rash on the left labia. No vaginal discharge found.  Musculoskeletal:  She exhibits tenderness. She exhibits no edema.       Arms:      Legs: Neurological: She is alert and oriented to person, place, and time.  Skin: Skin is warm and dry. No erythema.  Psychiatric: She has a normal mood and affect.   Results for orders placed during the hospital encounter of 07/04/13 (from the past 24 hour(s))  URINALYSIS, ROUTINE W REFLEX MICROSCOPIC     Status: Abnormal   Collection Time    07/04/13  4:40 PM      Result Value Range   Color, Urine YELLOW  YELLOW   APPearance CLEAR  CLEAR   Specific Gravity, Urine 1.020  1.005 - 1.030   pH 6.0  5.0 - 8.0   Glucose, UA NEGATIVE  NEGATIVE mg/dL   Hgb urine dipstick SMALL (*) NEGATIVE   Bilirubin Urine NEGATIVE  NEGATIVE   Ketones, ur NEGATIVE  NEGATIVE mg/dL    Protein, ur NEGATIVE  NEGATIVE mg/dL   Urobilinogen, UA 1.0  0.0 - 1.0 mg/dL   Nitrite NEGATIVE  NEGATIVE   Leukocytes, UA SMALL (*) NEGATIVE  URINE MICROSCOPIC-ADD ON     Status: None   Collection Time    07/04/13  4:40 PM      Result Value Range   Squamous Epithelial / LPF RARE  RARE   WBC, UA 7-10  <3 WBC/hpf   RBC / HPF 0-2  <3 RBC/hpf   Bacteria, UA RARE  RARE    MAU Course  Procedures None  MDM UA today Ibuprofen and Flexeril given in MAU - patient reports improvement in symtpoms Urine culture ordered  Assessment and Plan  A: Back pain, muscle strain Leg pain Post-Menopausal bleeding secondary to fibroids Contstipation  P: Discharge home Rx for Nystatin, Triamcinolone, Flexeril and Ibuprofen sent to patient's pharmacy Urine culture pending Discussed returning to clinic for further evaluation of brown spotting from fibroids Patient given list of resources in the community for follow-up care for there low back and leg pain Patient advised to go to [facility] or [facility] if her back and/or leg pain were to persist or worsen Patient may return to MAU as needed or if her condition were to change or worsen  Freddi Starr, PA-C  07/04/2013, 6:44 PM

## 2013-07-04 NOTE — MAU Provider Note (Signed)
Attestation of Attending Supervision of Advanced Practitioner (CNM/NP): Evaluation and management procedures were performed by the Advanced Practitioner under my supervision and collaboration.  I have reviewed the Advanced Practitioner's note and chart, and I agree with the management and plan.  HARRAWAY-SMITH, Delrose Rohwer 11:02 PM

## 2013-07-04 NOTE — Discharge Instructions (Signed)
[place] Resource Guide °(Revised August 2014) ° ° °Chronic Pain Problems:  °• [facility]:  [phone]  °         Patients need to be referred by their primary care doctor/specialist ° °Insufficient Money for Medicine:  °·         United Way: call "211"   °• MAP Program at [facility] [place] 641-8030 or [place] 641-7620 °          ° °No Primary Care Doctor:  °To locate a primary care doctor that accepts your insurance or provides certain services:  °·         [facility]: 832-8000  °·         Physician Referral Service: 1-800-533-3463 ask for “My [place]” °• If no insurance, you need to see if you qualify for GCCN “orange card”, call to set      up appointment for eligibility/enrollment at [phone] or [phone] or visit [place] [facility] ([address], [place] and [address] -[place]) to meet with a GCCN enrollment specialist. ° °Agencies that provide inexpensive (sliding fee scale) medical care:  ° °•    [facility] - [facility] [place] - [phone] °•    [facility]  -  [facility] - [phone] °•    [facility] - 336-832-7272 °•    [facility] - 336-832-4444 °•    [facility] - 336-832-3150 °•    [facility] - 336-832-8035 °• [facility] - [facility] [place] - 336-272-     1050 °• [facility] [place] - 336-370-9091 °• [facility]: 336-832-8035  °• [facility]: 336-832-4777  °• [facility]: 336-373-0678  °• [facility] - 336- 387-6161 ° ° ° °Medicaid-accepting [place] Providers:  °·         [facility] - 641-2100 (No Family Planning accepted) °·         [address], 641-2100, Mon-Fri 9am-5pm °·          [facility] - [phone] °• [address], [address], Mon-Thursday 8am-5pm, Fri 8am-noon °• [facility] - [phone] °·         [address], [address], Mon-Fri 7:30am-4:30pm °·         [facility] - [phone] °·         [address] [place] [address], Mon-Fri 8am-5pm  °·        [facility] - [phone] °·         [address] °·         Only accepts [place] Access Medicaid patients after they have their name applied to their card ° °Self Pay (no insurance) in [place]:  °         Sickle Cell Patients:  °• [address], (336) 832-1970 °[facility]: °• [address], [place] (336) 832-7272 ° °     [facility] °• [address], [place] (336) 832-4444 ° °[facility]: °• [address], (336) [phone] °           [facility]  °·         [address], (336) [phone] °[facility] °• [address], (336) [phone] °          [facility]  °·         [address], [place] [phone] °       [facility] - [address]  °·         641-2100, Mon-Fri 9am-7pm, Sat 9am-1pm °         [facility] [place] °·         [address] [place] [address], [phone] °         [facility] [place]  °·         [address], [phone] °[facility] [place] °• [address], [place] (336) 884-0224 °         [facility]  °·         [address] [place] [address], [phone] ° [facility]  °• [address], (336) 272-1050 °[facility] °• [address], (336) 370-9091 ° Dr. Osei-Bonsu  °·         [address], [place],  [phone] °         [facility]  °·         [address], [phone] °         [facility]  °·           [address], [phone] °         [facility]  °·         [address], [phone], 1st & 3rd Saturday every month, 10am-1pm ° °OTHERS:  [facility]  ([facility] Only)  ([phone]) [phone] (Thursday only) ° °Strategies for finding a Primary Care Provider:  °1) Find a Doctor and Pay Out of Pocket  °Although you won't have to find out who is covered by your insurance plan, it is a good idea to ask around and get recommendations. You will then need to call the office and see if the doctor you have chosen will accept you as a new patient and what types of options they offer for patients who are self-pay. Some doctors offer discounts or will set up payment plans for their patients who do not have insurance, but you will need to ask so you aren't surprised when you get to your appointment.  °2) Contact [facility] - To see if you qualify for “orange card” access to healthcare safety net providers.  Call for appointment for eligibility/enrollment at [phone] or [phone]. (Uninsured, 0-200% FPL, qualifying info)  °Applicants for GCCN are first required to see if they are eligible to enroll in the ACA Marketplace before enrolling in GCCN (and get an exemption if they are not).  °GCCN Criteria for acceptance is:  °? Proof of ACA Marketing exemption - form or documentation  °? Valid photo ID (driver's license, state identification card, passport, home country ID)  °? Proof of [place] residency (e.g. driver’s license, lease/landlord information, pay stubs with address, utility   bill, bank statement, etc.)  °? Proof of income (1040, last year's tax return, W2, 4 current pay stubs, other income proof)  °? Proof of assets (current bank statement + 3 most recent, disability paperwork, life insurance info, tax value on autos, etc.) ° °3)  Contact Your Local Health Department  °Not all health departments have doctors that can see patients for sick visits, but many do, so it is worth a call to see if yours does. If you don't know where your local health department is, you can check in your phone book. The CDC also has a tool to help you locate your state's health department, and many state websites also have listings of all of their local health departments.  °4) Find a Walk-in Clinic  °If your illness is not likely to be very severe or complicated, you may want to try a walk in clinic. These are popping up all over the country in pharmacies, drugstores, and shopping centers. They're usually staffed by nurse practitioners or physician assistants that have been trained to treat common illnesses and complaints. They're usually fairly quick and inexpensive. However, if you have serious medical issues or chronic medical problems, these are probably not your best option  ° °STD Testing:  °·         [facility] [place], STD Clinic  °·         [address], [place], phone [phone] or [phone]  °·         Monday - Friday, call for an appointment °·         [facility] [place], [facility] Clinic  °·         [address], [place], phone [phone] or [phone]  °·         Monday - Friday, call for an appointment °Abuse/Neglect:  °·         [place] Child Abuse Hotline: 641-3795  °·         [place] Child Abuse Hotline: 800-378-5315 (After Hours) ° °Emergency Shelter:  °[facility] (336) 271-5959 ° [facility] [place]- (336) 881-5415 ° [facility] [place] - (336) 235-0330 ° [facility] - (336) 375-1332 (ages 11-17) ° Homeless Day Shelter @ [facility] - (336) 332-0824 °  °Mammograms - Free at [facility] - [place] - 614-3233 ° °Maternity Homes:  °·         [facility]: (336) 275-9566   (Homeless mother with  children) °·         [facility]: (704) 372-4663 (Mothers only) °• [facility]: (336) 370-9232 (Pregnant 16-21 years old) °• [facility] -(336) 641-7777 ° °[place] [facility]  °• [facility] °• [address], [place] (336) 355-9701 °·         [facility] [place]  °·         [address], [place]  °·         349-3220 °·         [employer]  °·         [address], [place]  °·         342-7768 °·         [facility]  °·         [address], [place]  °·         342-8140 °·         [facility]  °·         342-8316 °·         [place] [facility]  °·         1-888-581-9988 °·         [facility] [place]  °·         [address]  °·         336-349-4454, Insurance °·         [place] Child Abuse Hotline  °·         (336) 342-1394  °·         (336) 342-3537 (After Hours) ° °Behavioral Health Services /Substance Abuse Resources:  °·         [facility]: 882-2125  °·         [facility]: 784-9470  °·        [facility]: (336) 285-9073  °• Narcotics [facility]pl[facility] - 1-866-375-1272 °·         [facility]: (336) 845-3988  °·         [facility]stance Abuse [facility] - [facility]: 800-659-3381 °• [facility] - (336) 285-2605 °Psychological Services: °·         [facility]: 832-9600  °• [facility]: 1-877-626-1772 °·         [facility]  °·         [address] [place] [address], [place]  °·         ACCESS LINE: 1-800-256-2452     (24 Hour) °• Mobile Crisis:  °• HELPLINES:  °[facility] [place] (336) 370-4264 °[facility] [place] (800) 451-9682 °• Walk In Crisis Services °      [facility] [address] [place] [address] - [place]  (336)676-6905 °       [facility] - (336)832-9600 or (336) 832-9700 ° [facility] - [address] - [place] (336)899-1505 ° [facility] - [address], [place] (336) 878-6098 ° ° °Dental Assistance:  °If unable to pay or uninsured, contact: [place] [facility] to become qualified for the adult dental clinic. Patient must be enrolled in GCCN (uninsured, 0-200% FPL, qualifying info).  Enroll in GCCN first, then see Primary Care Physician assigned to you, the PCP makes a dental referral. [facility] will receive referral and contacts patient for appointment. ° °Patients with Medicaid  °         [address], 510-2600 ° [facility]Children up to 20 + Pregnant Women) - (336) 641-3152 ° [facility] - [address] - [address] (336) 235-2808 ° °If unable to pay, or uninsured: contact [place] [facility] (641-3152 in [place] - (Children only + Pregnant Women), 641-7733 in [place]- Children only) to become qualified for the adult dental clinic  °Must see if eligible to enroll in ACA Health Insurance Marketplace before enrolling into the GCCN (exemption required) (1-855-733-3711 for an appointment)  www.healthcare.gov;   1-800-318-2596.  If not eligible for ACA, then go by Department of Health and Human Services to see if eligible for “orange card.”    [address], [place] and [address]- [place].  Once you get an orange card, you will have a Primary Care home who will then refer you to dental if needed. ° ° ° ° ° ° ° °Other Low-Cost Community Dental Services:  ° [facility] - 334-4822 (ext 50251) °  [address] [place] [address] ° Dr. Civils - 272-4177 °  [address] °  ° [facility] - 734-7550 °  [address] ° °         [facility]  °         [address], [place], [place], [address]  °         723-1848, Ext. 123  °         2nd and 4th Thursday of the month at 6:30am (Simple  extractions only - no wisdom teeth or surgery) First come/First serve -First 10 clients served ° °         [facility] ([place] residents only) °         [address], [place], [place], [address]  °         723-7904 °          °         [facility]  °         342-8273 °         [facility]  °        703-3100 °        [facility] °         570-6415 °  °Transportation Options: ° Ambulance - 911 - $250-$700 per ride °Family Member to accompany patient (if stable) - [place] [employer] - (336) 355-6499 ° PART - (336) 662-0002 ° Taxi - (336) 272-5112 - [place] ° [facility] - (336) 333-6589 (Application required) ° [place] [facility] - (336) 641-4848 °  ° °

## 2013-07-05 LAB — URINE CULTURE
Colony Count: NO GROWTH
Culture: NO GROWTH

## 2013-08-11 ENCOUNTER — Encounter: Payer: Self-pay | Admitting: Obstetrics & Gynecology

## 2013-08-11 ENCOUNTER — Ambulatory Visit (INDEPENDENT_AMBULATORY_CARE_PROVIDER_SITE_OTHER): Payer: Self-pay | Admitting: Obstetrics & Gynecology

## 2013-08-11 VITALS — BP 136/74 | HR 65 | Temp 96.2°F | Ht 61.0 in | Wt 144.6 lb

## 2013-08-11 DIAGNOSIS — K5900 Constipation, unspecified: Secondary | ICD-10-CM

## 2013-08-11 NOTE — Patient Instructions (Addendum)
Fibroma uterino  (Uterine Fibroid)  Un fibroma uterino es un crecimiento (tumor) dentro del tero de una mujer. Este tipo de tumor no es canceroso y no se extiende fuera del tero. Una mujer puede tener uno o varios fibromas y algunos pueden ser bastante grandes. Un fibroma puede variar en tamao, peso y el lugar en que se desarrolla dentro del tero. La mayora de los fibromas no necesitan tratamiento mdico, pero algunos pueden causar dolor o sangrado abundante durante los perodos y entre ellos. CAUSAS  Un fibroma es el resultado del desarrollo continuo de una nica clula uterina que sigue creciendo (no regulada) que es diferente al resto de las clulas del cuerpo humano. La mayora de las clulas tiene un mecanismo de control que evita que se reproduzcan de manera descontrolada.  SNTOMAS   Sangrado.  Dolor y sensacin de presin en la pelvis.  Problemas en la vejiga debido al tamao del fibroma.  Infertilidad y abortos espontneos, segn el tamao y la ubicacin del fibroma. DIAGNSTICO  El diagnstico se hace por examen fsico. El mdico puede palpar los tumores abultados al realizar el examen de la pelvis. Una ecografa puede brindar informacin adicional importante acerca del tamao, la ubicacin y el nmero de tumores. Es raro que sea necesario realizar otras pruebas, como tomografa computada o resonancia magntica.  TRATAMIENTO   Su mdico puede considerar que es conveniente esperar y prestar atencin. Esto incluye el control del fibroma por parte del mdico para observar si crece o disminuye su tamao.   Podr recomendarle un tratamiento hormonal o el uso de un dispositivo intrauterino (DIU).   En algunos casos es necesaria la ciruga para extirpar el fibroma (miomectoma) o el tero (histerectoma). Esto depender de su situacin. Cuando una mujer desea quedar embarazada y los fibromas interfieren en su fertilidad, el mdico puede recomendar la extirpacin del fibroma.    INSTRUCCIONES PARA EL CUIDADO EN EL HOGAR  Los cuidados en el hogar dependen del tratamiento que haya recibido. En general:   Concurra puntualmente a las citas de control con el mdico.   Slo tome los medicamentos que le indic su mdico. No tome aspirina. Puede ocasionar hemorragias.   Si tiene perodos muy abundantes y debe cambiar un tampn o una toalla higinica en media hora o menos, comunquese con su mdico inmediatamente. Si sus perodos son molestos pero no tan abundantes, acustese con los pies ligeramente elevados por encima del nivel del corazn. Coloque compresas fras en la zona inferior del abdomen.   Si sus perodos son muy abundantes  , anote el nmero de compresas o tampones que usa cada mes. Lleve esta informacin cuando visite a su mdico.   Consulte al mdico si debe tomar pldoras de hierro.   Incluya vegetales en su dieta.   Si le recetaron un tratamiento hormonal, tome los medicamentos hormonales como le indicaron.   Si le indicaron la ciruga, pdale informacin especfica a su mdico.  SOLICITE ATENCIN MDICA DE INMEDIATO SI:   Siente dolor o clicos en la pelvis y no puede controlarlos con los medicamentos.   El dolor en la pelvis aumenta de manera repentina.   Aumenta el sangrado entre los perodos o durante los mismos.   Se siente mareado o tiene episodios de desmayo.  ASEGRESE DE QUE:   Comprende estas instrucciones.  Controlar su enfermedad.  Solicitar ayuda de inmediato si no mejora o si empeora. Document Released: 09/15/2005 Document Revised: 12/08/2011 ExitCare Patient Information 2014 [employer].  

## 2013-08-11 NOTE — Progress Notes (Signed)
Referral to [facility] faxed to establish primary care and for rectal bleeding.  New patient packet for referral given to patient.

## 2013-08-11 NOTE — Progress Notes (Signed)
Patient states that she is here today because of her fibroids. She states that she was told she needs to have surgery because the fibroids are large and pushing on her rectum causing her chronic constipation.

## 2013-08-12 NOTE — Progress Notes (Signed)
Subjective:     Patient ID: Amy Craig, female   DOB: 12/02/1945, 67 y.o.   MRN: [id number]  HPI Pt presents for eval of constipation.  She reports that she was previously told that her constipation was due to her very large fibroids.  She take Miralax 2x per day.  She denies vaginal bleeding but. Does c/o occ rectal bleeding that she has never been evaluated for.  She dies any vaginal/uterine bleeding.  She had a colonoscopy >5 years prev. No current GI bleeding.  Past Medical History  Diagnosis Date  . Hypercalcemia   . Constipation   . Infection     UTI  . Pyelonephritis   . Fracture     rt ankle  . Fibroid   . Anxiety     with pregnancies   Past Surgical History  Procedure Laterality Date  . Parathyroidectomy      Ask patient to confirm. Detail not on history form dated 10/25/10.   Current Outpatient Prescriptions on File Prior to Visit  Medication Sig Dispense Refill  . polyethylene glycol (MIRALAX / GLYCOLAX) packet Take 17 g by mouth daily.      Marland Kitchen amitriptyline (ELAVIL) 25 MG tablet Take 25 mg by mouth as needed.        . cyclobenzaprine (FLEXERIL) 10 MG tablet Take 1 tablet (10 mg total) by mouth 2 (two) times daily as needed for muscle spasms.  20 tablet  0  . Flaxseed, Linseed, (GNP FLAXSEED PO) Take 1 tablet by mouth daily.      Marland Kitchen ibuprofen (ADVIL,MOTRIN) 800 MG tablet Take 1 tablet (800 mg total) by mouth 3 (three) times daily.  21 tablet  0  . nystatin cream (MYCOSTATIN) Apply to affected area 2 times daily  15 g  0  . triamcinolone cream (KENALOG) 0.1 % Apply topically 2 (two) times daily.  30 g  0   No current facility-administered medications on file prior to visit.   Allergies  Allergen Reactions  . Sulfa Antibiotics Rash    Confirm with patient if antibiotics or drug reactors she responds to.     Review of Systems     Objective:   Physical Exam BP 136/74  Pulse 65  Temp(Src) 96.2 F (35.7 C) (Oral)  Ht 5\' 1"  (1.549 m)  Wt 144 lb 9.6 oz (65.59  kg)  BMI 27.34 kg/m2 Pt in NAD Lungs: CTA CV: RRR Abd: soft, NT; ND  GU: EGBUS: no lesions Vagina: no blood in vault Cervix: no lesion; no mucopurulent d/c Uterus: enlarged- ~12-14 weeks; mobile; no posterior component Adnexa: no masses; sl tender          Assessment:     Constipation Uterine fibroids- asymptomatic Rectal bleeding      Plan:     Referral to primary care to eval GI sx F/u GYN prn

## 2013-08-20 ENCOUNTER — Ambulatory Visit: Payer: Self-pay

## 2013-08-31 ENCOUNTER — Encounter: Payer: Self-pay | Admitting: Internal Medicine

## 2013-08-31 ENCOUNTER — Ambulatory Visit: Payer: Self-pay | Attending: Internal Medicine | Admitting: Internal Medicine

## 2013-08-31 VITALS — BP 136/77 | HR 55 | Temp 98.7°F | Resp 14 | Ht 62.0 in | Wt 143.6 lb

## 2013-08-31 DIAGNOSIS — K5900 Constipation, unspecified: Secondary | ICD-10-CM | POA: Insufficient documentation

## 2013-08-31 DIAGNOSIS — E119 Type 2 diabetes mellitus without complications: Secondary | ICD-10-CM

## 2013-08-31 LAB — CBC WITH DIFFERENTIAL/PLATELET
Basophils Absolute: 0 K/uL (ref 0.0–0.1)
Basophils Relative: 1 % (ref 0–1)
Eosinophils Absolute: 0.1 K/uL (ref 0.0–0.7)
Eosinophils Relative: 2 % (ref 0–5)
HCT: 42.1 % (ref 36.0–46.0)
Hemoglobin: 14.3 g/dL (ref 12.0–15.0)
Lymphocytes Relative: 39 % (ref 12–46)
Lymphs Abs: 2.6 K/uL (ref 0.7–4.0)
MCH: 30.8 pg (ref 26.0–34.0)
MCHC: 34 g/dL (ref 30.0–36.0)
MCV: 90.7 fL (ref 78.0–100.0)
Monocytes Absolute: 0.4 K/uL (ref 0.1–1.0)
Monocytes Relative: 6 % (ref 3–12)
Neutro Abs: 3.5 K/uL (ref 1.7–7.7)
Neutrophils Relative %: 52 % (ref 43–77)
Platelets: 291 K/uL (ref 150–400)
RBC: 4.64 MIL/uL (ref 3.87–5.11)
RDW: 13.2 % (ref 11.5–15.5)
WBC: 6.6 K/uL (ref 4.0–10.5)

## 2013-08-31 LAB — POCT GLYCOSYLATED HEMOGLOBIN (HGB A1C): Hemoglobin A1C: 5.2

## 2013-08-31 MED ORDER — POLYETHYLENE GLYCOL 3350 17 G PO PACK: 17.0000 g | PACK | Freq: Two times a day (BID) | ORAL | Status: AC

## 2013-08-31 MED ORDER — DOCUSATE SODIUM 100 MG PO CAPS: 100.0000 mg | ORAL_CAPSULE | Freq: Two times a day (BID) | ORAL | Status: AC

## 2013-08-31 NOTE — Progress Notes (Signed)
Pt is here to establish care and receive a HBA1c to check to see if pt is a diabetic. Pt complains of burning while urination x20 days and constipation x1 month.

## 2013-08-31 NOTE — Progress Notes (Signed)
Patient ID: Amy Craig, female   DOB: Feb 09, 1946, 67 y.o.   MRN: [id number]   CC:  HPI:  67 year old female primarily here for evaluation of her constipation. The patient has 2 very large fibroids in the uterus. The patient was seen by gynecology and was told to continue with MiraLAX. She was told that a hysterectomy may or may not fix her constipation issue. She is a poor historian and most of the history is obtained with the help of an interpreter line She denies any rectal bleeding she states that she had a colonoscopy about 3 years ago which was within normal limits She denies any vaginal/uterine bleeding She says there are no plans for a hysterectomy   She also complains of increased urinary frequency at night and is requesting a screening test for diabetes, no fever no flank pain   she denies any thyroid problems but states that she had palpitations 2 days ago, she denies any cardiac history  Allergies  Allergen Reactions  . Sulfa Antibiotics Rash    Confirm with patient if antibiotics or drug reactors she responds to.   Past Medical History  Diagnosis Date  . Hypercalcemia   . Constipation   . Infection     UTI  . Pyelonephritis   . Fracture     rt ankle  . Fibroid   . Anxiety     with pregnancies   Current Outpatient Prescriptions on File Prior to Visit  Medication Sig Dispense Refill  . amitriptyline (ELAVIL) 25 MG tablet Take 25 mg by mouth as needed.        . cyclobenzaprine (FLEXERIL) 10 MG tablet Take 1 tablet (10 mg total) by mouth 2 (two) times daily as needed for muscle spasms.  20 tablet  0  . Flaxseed, Linseed, (GNP FLAXSEED PO) Take 1 tablet by mouth daily.      Marland Kitchen ibuprofen (ADVIL,MOTRIN) 800 MG tablet Take 1 tablet (800 mg total) by mouth 3 (three) times daily.  21 tablet  0  . nystatin cream (MYCOSTATIN) Apply to affected area 2 times daily  15 g  0  . triamcinolone cream (KENALOG) 0.1 % Apply topically 2 (two) times daily.  30 g  0   No current  facility-administered medications on file prior to visit.   Family History  Problem Relation Age of Onset  . Diabetes Father   . Cancer Brother     "head" noted on history form. Clarify w/patient.  . Alzheimer's disease Sister   . Diabetes Brother    History   Social History  . Marital Status: Widowed    Spouse Name: N/A    Number of Children: N/A  . Years of Education: N/A   Occupational History  . Not on file.   Social History Main Topics  . Smoking status: Never Smoker   . Smokeless tobacco: Never Used  . Alcohol Use: No     Comment: rare  . Drug Use: No  . Sexual Activity: Not Currently    Birth Control/ Protection: Post-menopausal   Other Topics Concern  . Not on file   Social History Narrative  . No narrative on file    Review of Systems  Constitutional: Negative for fever, chills, diaphoresis, activity change, appetite change and fatigue.  HENT: Negative for ear pain, nosebleeds, congestion, facial swelling, rhinorrhea, neck pain, neck stiffness and ear discharge.   Eyes: Negative for pain, discharge, redness, itching and visual disturbance.  Respiratory: Negative for cough, choking, chest tightness,  shortness of breath, wheezing and stridor.   Cardiovascular: Negative for chest pain, palpitations and leg swelling.  Gastrointestinal: Negative for abdominal distention.  Genitourinary: Negative for dysuria, urgency, frequency, hematuria, flank pain, decreased urine volume, difficulty urinating and dyspareunia.  Musculoskeletal: Negative for back pain, joint swelling, arthralgias and gait problem.  Neurological: Negative for dizziness, tremors, seizures, syncope, facial asymmetry, speech difficulty, weakness, light-headedness, numbness and headaches.  Hematological: Negative for adenopathy. Does not bruise/bleed easily.  Psychiatric/Behavioral: Negative for hallucinations, behavioral problems, confusion, dysphoric mood, decreased concentration and agitation.     Objective:   Filed Vitals:   08/31/13 1217  BP: 136/77  Pulse: 55  Temp: 98.7 F (37.1 C)  Resp: 14    Physical Exam  Constitutional: Appears well-developed and well-nourished. No distress.  HENT: Normocephalic. External right and left ear normal. Oropharynx is clear and moist.  Eyes: Conjunctivae and EOM are normal. PERRLA, no scleral icterus.  Neck: Normal ROM. Neck supple. No JVD. No tracheal deviation. No thyromegaly.  CVS: RRR, S1/S2 +, no murmurs, no gallops, no carotid bruit.  Pulmonary: Effort and breath sounds normal, no stridor, rhonchi, wheezes, rales.  Abdominal: Soft. BS +,  no distension, tenderness, rebound or guarding.  Musculoskeletal: Normal range of motion. No edema and no tenderness.  Lymphadenopathy: No lymphadenopathy noted, cervical, inguinal. Neuro: Alert. Normal reflexes, muscle tone coordination. No cranial nerve deficit. Skin: Skin is warm and dry. No rash noted. Not diaphoretic. No erythema. No pallor.  Psychiatric: Normal mood and affect. Behavior, judgment, thought content normal.   Lab Results  Component Value Date   WBC 5.9 11/28/2010   HGB 14.6 12/03/2010   HCT 43.0 12/03/2010   MCV 92.7 11/28/2010   PLT 299 11/28/2010   Lab Results  Component Value Date   CREATININE 0.7 12/03/2010   BUN <3* 12/03/2010   NA 141 12/03/2010   K 4.8 12/03/2010   CL 104 12/03/2010   CO2 28 11/28/2010    No results found for this basename: HGBA1C   Lipid Panel     Component Value Date/Time   CHOL 185 05/31/2010 1922   TRIG 105 05/31/2010 1922   HDL 61 05/31/2010 1922   CHOLHDL 3.0 Ratio 05/31/2010 1922   VLDL 21 05/31/2010 1922   LDLCALC 103* 05/31/2010 1922       Assessment and plan:   Patient Active Problem List   Diagnosis Date Noted  . Thyroid disease 03/17/2011  . Arthritis 03/17/2011  . Family history of breast cancer 03/17/2011   Constipation According to the patient she had a normal colonoscopy 3 years ago She denies any perineal bleeding including  hematochezia, vaginal bleeding, no weight loss, no abdominal pain We'll check a CBC    TSH, kidney function Increase MiraLAX to 17 g twice a day Encouraged generous intake of fluids Upon reviewing the chart the patient has had CT scan in 2007 in 2008 that was negative   Urinary frequency UA pending   Palpitations Will check TSH, 2-D echo, EKG,  Have patient followup in about 3 weeks to review everything  The patient was given clear instructions to go to ER or return to medical center if symptoms don't improve, worsen or new problems develop. The patient verbalized understanding. The patient was told to call to get any lab results if not heard anything in the next week.

## 2013-09-01 LAB — LIPID PANEL
Cholesterol: 180 mg/dL (ref 0–200)
HDL: 53 mg/dL (ref >39)
LDL Cholesterol: 109 mg/dL — ABNORMAL HIGH (ref 0–99)
Total CHOL/HDL Ratio: 3.4 ratio
Triglycerides: 91 mg/dL (ref <150)
VLDL: 18 mg/dL (ref 0–40)

## 2013-09-01 LAB — URINALYSIS
Bilirubin Urine: NEGATIVE
Glucose, UA: NEGATIVE mg/dL
Hgb urine dipstick: NEGATIVE
Ketones, ur: NEGATIVE mg/dL
Nitrite: NEGATIVE
Protein, ur: NEGATIVE mg/dL
Specific Gravity, Urine: <1.005 — ABNORMAL LOW (ref 1.005–1.030)
Urobilinogen, UA: 0.2 mg/dL (ref 0.0–1.0)
pH: 7.5 (ref 5.0–8.0)

## 2013-09-01 LAB — COMPLETE METABOLIC PANEL WITHOUT GFR
ALT: 9 U/L (ref 0–35)
AST: 16 U/L (ref 0–37)
Albumin: 4.5 g/dL (ref 3.5–5.2)
Alkaline Phosphatase: 71 U/L (ref 39–117)
BUN: 11 mg/dL (ref 6–23)
CO2: 26 meq/L (ref 19–32)
Calcium: 10.3 mg/dL (ref 8.4–10.5)
Chloride: 105 meq/L (ref 96–112)
Creat: 0.61 mg/dL (ref 0.50–1.10)
GFR, Est African American: >89 mL/min
GFR, Est Non African American: >89 mL/min
Glucose, Bld: 96 mg/dL (ref 70–99)
Potassium: 4 meq/L (ref 3.5–5.3)
Sodium: 138 meq/L (ref 135–145)
Total Bilirubin: 0.4 mg/dL (ref 0.3–1.2)
Total Protein: 7.1 g/dL (ref 6.0–8.3)

## 2013-09-01 LAB — TSH: TSH: 1.239 u[IU]/mL (ref 0.350–4.500)

## 2013-09-14 ENCOUNTER — Ambulatory Visit: Payer: Self-pay | Attending: Internal Medicine | Admitting: Internal Medicine

## 2013-09-14 ENCOUNTER — Ambulatory Visit (HOSPITAL_COMMUNITY)
Admission: RE | Admit: 2013-09-14 | Discharge: 2013-09-14 | Disposition: A | Discharge status: Home / Self Care | Payer: Self-pay | Source: Ambulatory Visit | Attending: Internal Medicine | Admitting: Internal Medicine

## 2013-09-14 ENCOUNTER — Encounter: Payer: Self-pay | Admitting: Internal Medicine

## 2013-09-14 VITALS — BP 149/80 | HR 58 | Temp 98.1°F | Resp 16 | Ht 61.0 in | Wt 144.0 lb

## 2013-09-14 DIAGNOSIS — I359 Nonrheumatic aortic valve disorder, unspecified: Secondary | ICD-10-CM

## 2013-09-14 DIAGNOSIS — I10 Essential (primary) hypertension: Secondary | ICD-10-CM | POA: Insufficient documentation

## 2013-09-14 DIAGNOSIS — E119 Type 2 diabetes mellitus without complications: Secondary | ICD-10-CM

## 2013-09-14 DIAGNOSIS — R002 Palpitations: Secondary | ICD-10-CM | POA: Insufficient documentation

## 2013-09-14 MED ORDER — ZOLPIDEM TARTRATE 10 MG PO TABS: 10.0000 mg | ORAL_TABLET | Freq: Every evening | ORAL | Status: AC | PRN

## 2013-09-14 NOTE — Progress Notes (Signed)
  Echocardiogram 2D Echocardiogram has been performed.  Amy Craig 09/14/2013, 11:14 AM

## 2013-09-14 NOTE — Progress Notes (Signed)
Pt is here to review the results of Echo cardiogram.

## 2013-09-14 NOTE — Patient Instructions (Signed)
Ecocardiograma (Echocardiography) El profesional que lo asiste le ha solicitado un Science writer. Esta es una prueba en la que se producen imgenes del corazn utilizando ondas sonoras. Estas imgenes ayudan a identificar anormalidades del corazn [employer] se incluyen: las del [address] cardaco, el tamao de las cmaras, el funcionamiento de las vlvulas, los lquidos que rodean el corazn y anormalidades en la movilidad. ANTES DEL PROCEDIMIENTO Regstrese en el departamento de admisiones para completar los formularios si fuera necesario. PROCEDIMIENTO  No se requiere una preparacin especial. Vstase y coma normalmente.  El procedimiento demorar aproximadamente 45 minutos.  Deber quitarse la ropa de la cintura para [place].  Se lubricar con gel un pequeo dispositivo de mano y se lo Scientific laboratory technician en varios puntos sobre el pecho. ste enviar ondas sonoras inocuas que usted no sentir, y stas a su vez sern transmitidas a una mquina que imprime estos patrones en un papel, para que el cardilogo (el mdico especialista en el corazn) pueda leerlo.  Esta prueba es simple, indolora, y proporciona una informacin muy valiosa al profesional que lo asiste. RESULTADOS Los resultados del ecocardiograma demorarn aproximadamente entre 1 y 2 [address]  [address]. Consulte al profesional que lo asiste el modo en que podr [employer]. Es su responsabilidad retirar el resultado del [place]. Document Released: 09/15/2005 Document Revised: 06/09/2012 [facility] Patient Information 2014 [place], [place].

## 2013-09-14 NOTE — Progress Notes (Signed)
Patient ID: Amy Craig, female   DOB: 1946/06/22, 67 y.o.   MRN: [id number]   CC: Requesting results on echocardiogram  HPI: Patient is 67 year old female with history of chronic constipation and recurrent UTIs, who presents to clinic requesting results of the echocardiogram that she has had done earlier this morning. She has no concerns at this time. Denies chest pain, shortness of breath, no specific abdominal concerns, no specific urinary concerns. She has chronic constipation and has been taking MiraLax daily.  Allergies  Allergen Reactions  . Sulfa Antibiotics Rash    Confirm with patient if antibiotics or drug reactors she responds to.   Past Medical History  Diagnosis Date  . Hypercalcemia   . Constipation   . Infection     UTI  . Pyelonephritis   . Fracture     rt ankle  . Fibroid   . Anxiety     with pregnancies   Current Outpatient Prescriptions on File Prior to Visit  Medication Sig Dispense Refill  . polyethylene glycol (MIRALAX / GLYCOLAX) packet Take 17 g by mouth 2 (two) times daily.  60 packet  7  . amitriptyline (ELAVIL) 25 MG tablet Take 25 mg by mouth as needed.        . cyclobenzaprine (FLEXERIL) 10 MG tablet Take 1 tablet (10 mg total) by mouth 2 (two) times daily as needed for muscle spasms.  20 tablet  0  . docusate sodium (COLACE) 100 MG capsule Take 1 capsule (100 mg total) by mouth 2 (two) times daily.  10 capsule  0  . Flaxseed, Linseed, (GNP FLAXSEED PO) Take 1 tablet by mouth daily.      Marland Kitchen ibuprofen (ADVIL,MOTRIN) 800 MG tablet Take 1 tablet (800 mg total) by mouth 3 (three) times daily.  21 tablet  0  . nystatin cream (MYCOSTATIN) Apply to affected area 2 times daily  15 g  0  . triamcinolone cream (KENALOG) 0.1 % Apply topically 2 (two) times daily.  30 g  0   No current facility-administered medications on file prior to visit.   Family History  Problem Relation Age of Onset  . Diabetes Father   . Cancer Brother     "head" noted on history  form. Clarify w/patient.  . Alzheimer's disease Sister   . Diabetes Brother    History   Social History  . Marital Status: Widowed    Spouse Name: N/A    Number of Children: N/A  . Years of Education: N/A   Occupational History  . Not on file.   Social History Main Topics  . Smoking status: Never Smoker   . Smokeless tobacco: Never Used  . Alcohol Use: No     Comment: rare  . Drug Use: No  . Sexual Activity: Not Currently    Birth Control/ Protection: Post-menopausal   Other Topics Concern  . Not on file   Social History Narrative  . No narrative on file    Review of Systems  Constitutional: Negative for fever, chills, diaphoresis, activity change, appetite change and fatigue.  HENT: Negative for ear pain, nosebleeds, congestion, facial swelling, rhinorrhea, neck pain, neck stiffness and ear discharge.   Eyes: Negative for pain, discharge, redness, itching and visual disturbance.  Respiratory: Negative for cough, choking, chest tightness, shortness of breath, wheezing and stridor.   Cardiovascular: Negative for chest pain, palpitations and leg swelling.  Gastrointestinal: Negative for abdominal distention.  Genitourinary: Negative for dysuria, urgency, frequency, hematuria, flank pain, decreased urine  volume, difficulty urinating and dyspareunia.  Musculoskeletal: Negative for back pain, joint swelling, arthralgias and gait problem.  Neurological: Negative for dizziness, tremors, seizures, syncope, facial asymmetry, speech difficulty, weakness, light-headedness, numbness and headaches.  Hematological: Negative for adenopathy. Does not bruise/bleed easily.  Psychiatric/Behavioral: Negative for hallucinations, behavioral problems, confusion, dysphoric mood, decreased concentration and agitation.    Objective:   Filed Vitals:   09/14/13 1149  BP: 149/80  Pulse: 58  Temp: 98.1 F (36.7 C)  Resp: 16    Physical Exam  Constitutional: Appears well-developed and  well-nourished. No distress.  CVS: RRR, S1/S2 +, no murmurs, no gallops, no carotid bruit.  Pulmonary: Effort and breath sounds normal, no stridor, rhonchi, wheezes, rales.  Abdominal: Soft. BS +,  no distension, tenderness, rebound or guarding.    Lab Results  Component Value Date   WBC 6.6 08/31/2013   HGB 14.3 08/31/2013   HCT 42.1 08/31/2013   MCV 90.7 08/31/2013   PLT 291 08/31/2013   Lab Results  Component Value Date   CREATININE 0.61 08/31/2013   BUN 11 08/31/2013   NA 138 08/31/2013   K 4.0 08/31/2013   CL 105 08/31/2013   CO2 26 08/31/2013    Lab Results  Component Value Date   HGBA1C 5.2 08/31/2013   Lipid Panel     Component Value Date/Time   CHOL 180 08/31/2013 1237   TRIG 91 08/31/2013 1237   HDL 53 08/31/2013 1237   CHOLHDL 3.4 08/31/2013 1237   VLDL 18 08/31/2013 1237   LDLCALC 109* 08/31/2013 1237       Assessment and plan:  Hypertension I have explained to patient that echocardiogram results are not available yet. I assured patient that we will call her with results as an as they become available. We have discussed target blood pressure range and importance of checking blood pressure regularly. Patient advised to call his back if the numbers are > 140/90.

## 2013-12-13 ENCOUNTER — Ambulatory Visit: Payer: Self-pay | Admitting: Internal Medicine

## 2014-06-29 IMAGING — US US PELVIS COMPLETE
1 series · 13 of 25 positions shown · non-contrast
Comparison: CT abdomen/pelvis 12/08/2006

CLINICAL DATA: Enlarged uterus, post menopausal female



[Series 1: us pelvis complete · 13 of 45 slices shown]
[im 1/45]
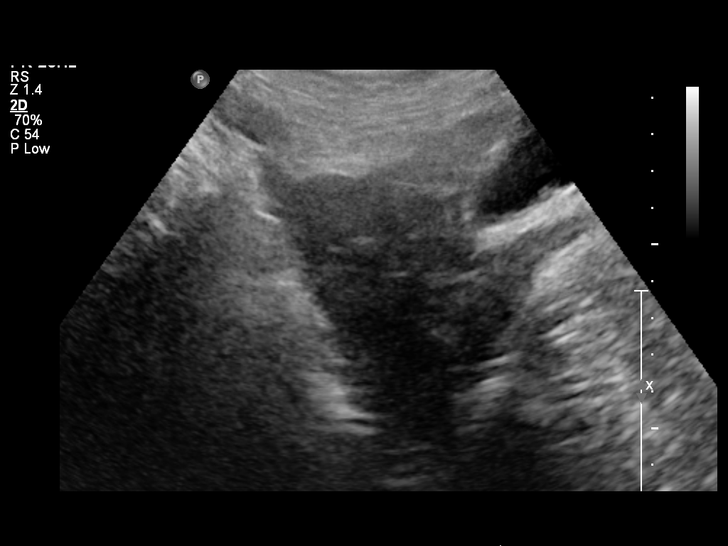
[im 4/45]
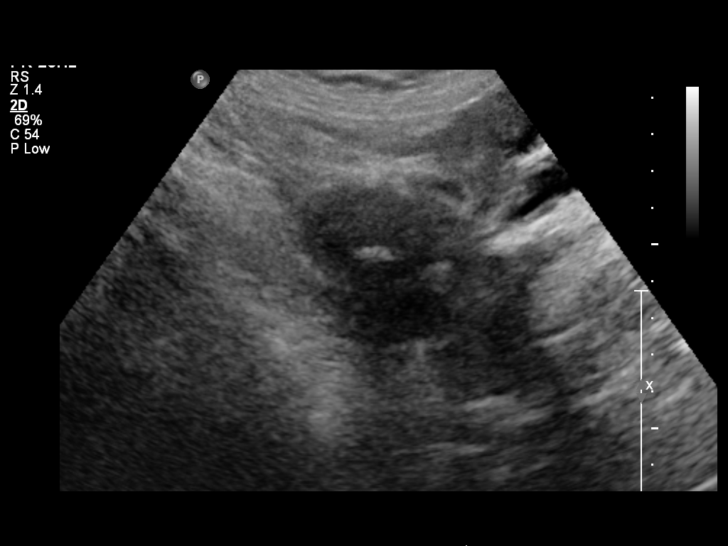
[im 8/45]
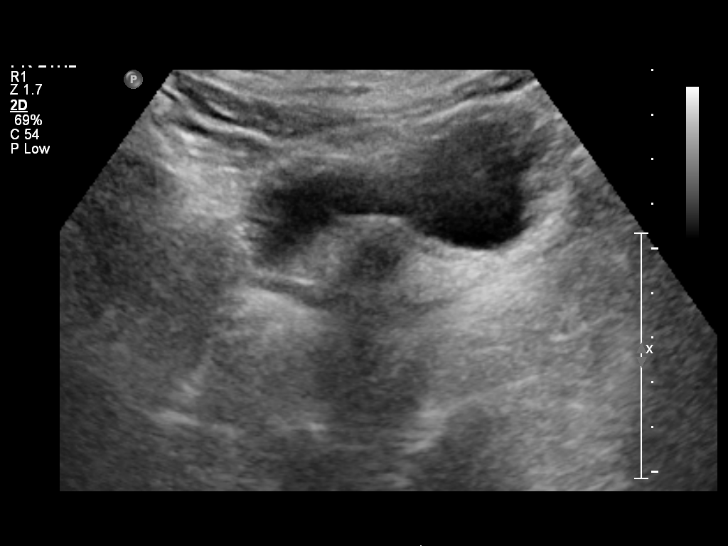
[im 12/45]
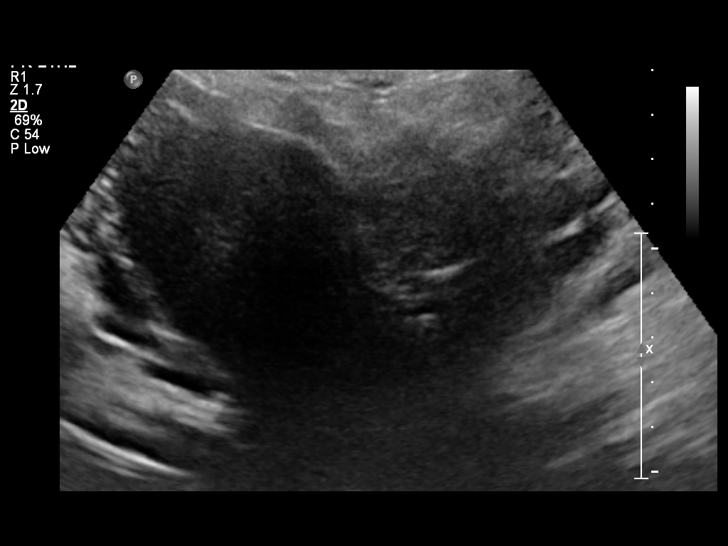
[im 15/45]
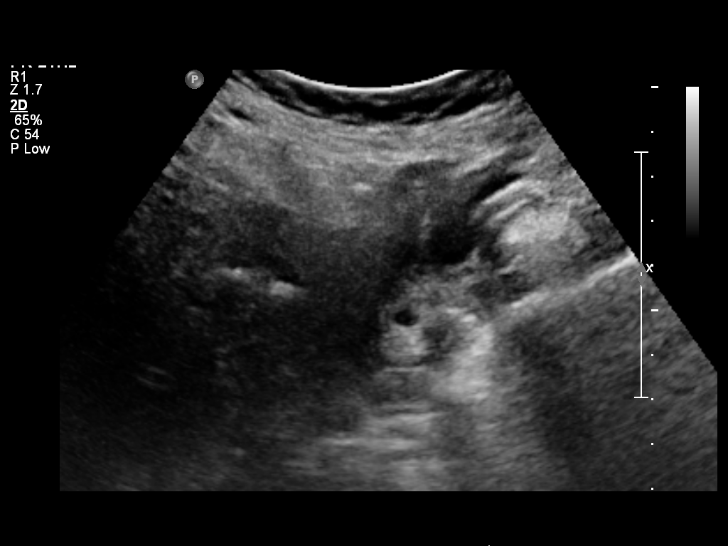
[im 19/45]
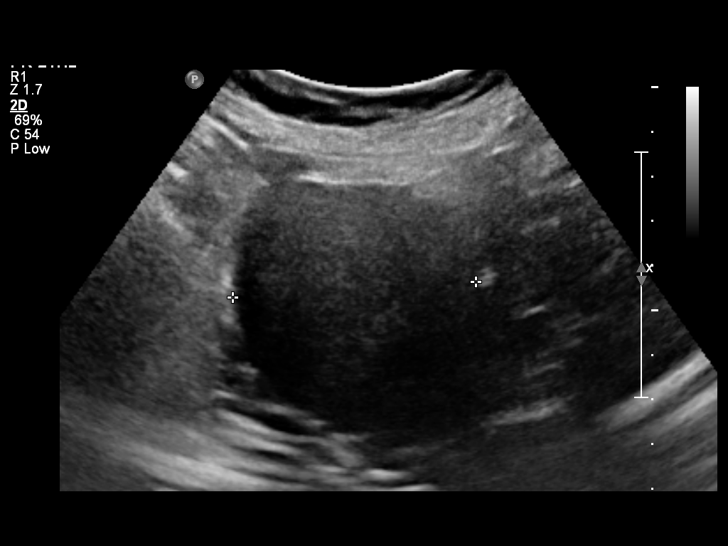
[im 23/45]
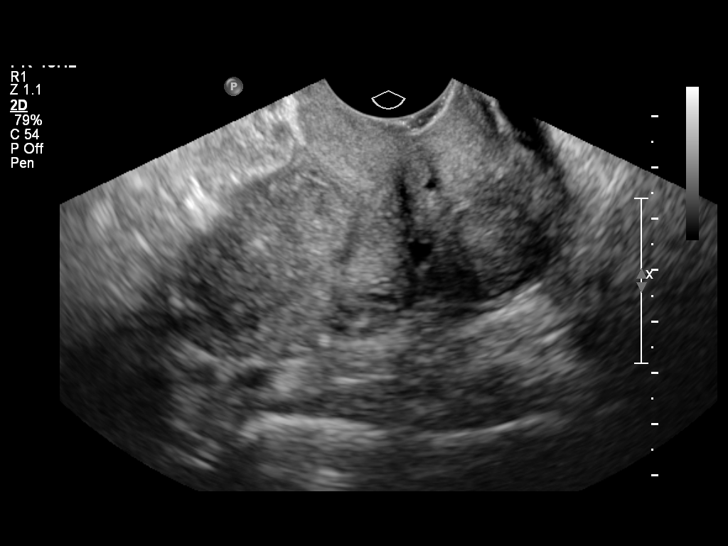
[im 26/45]
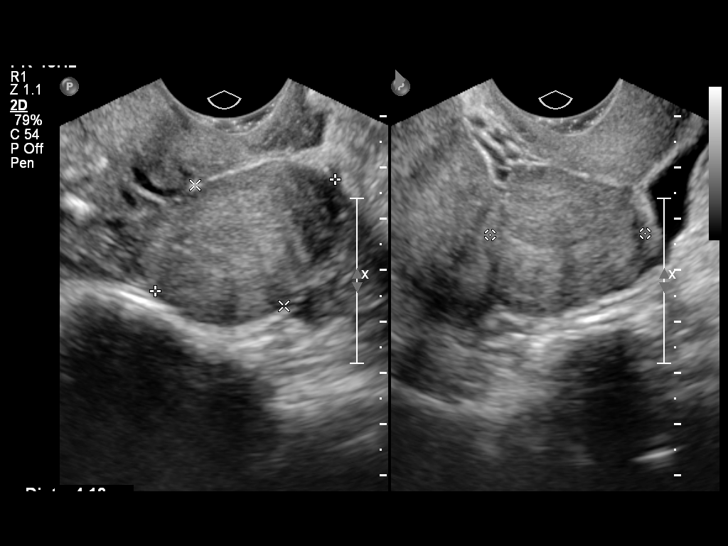
[im 30/45]
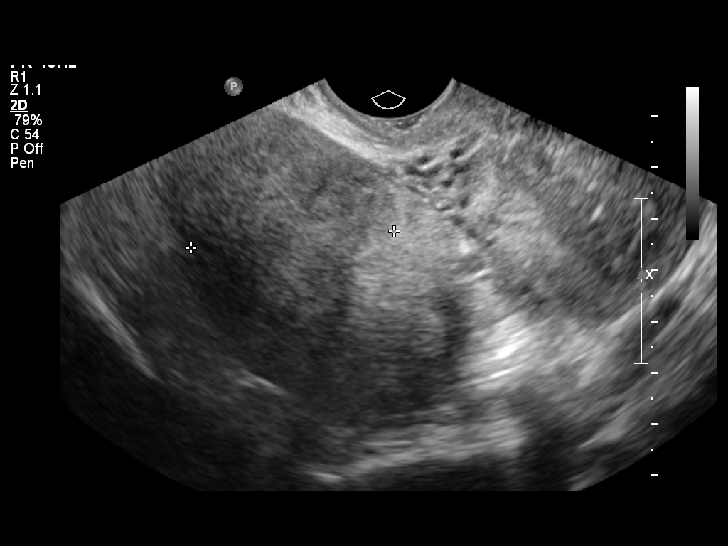
[im 34/45]
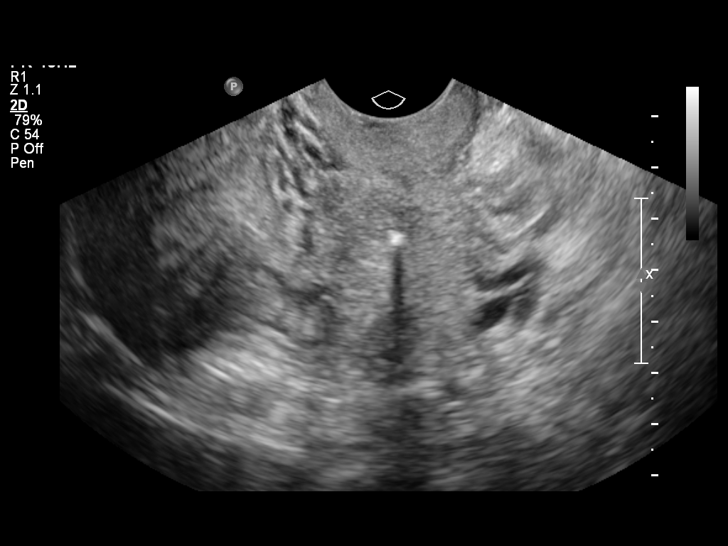
[im 37/45]
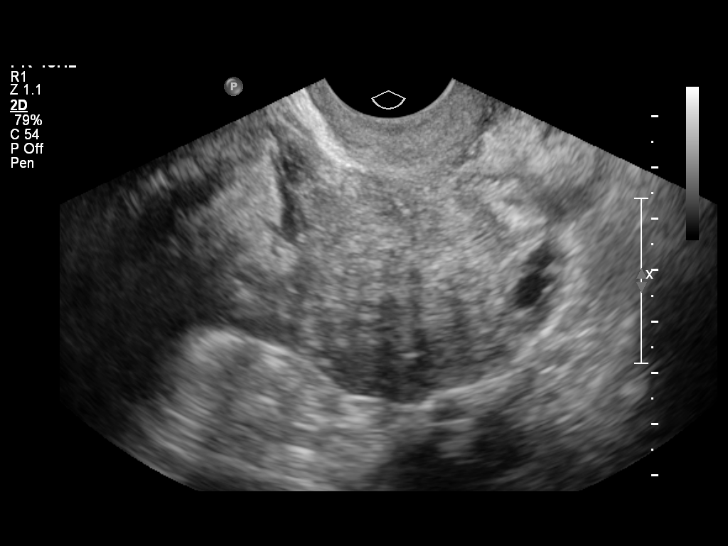
[im 41/45]
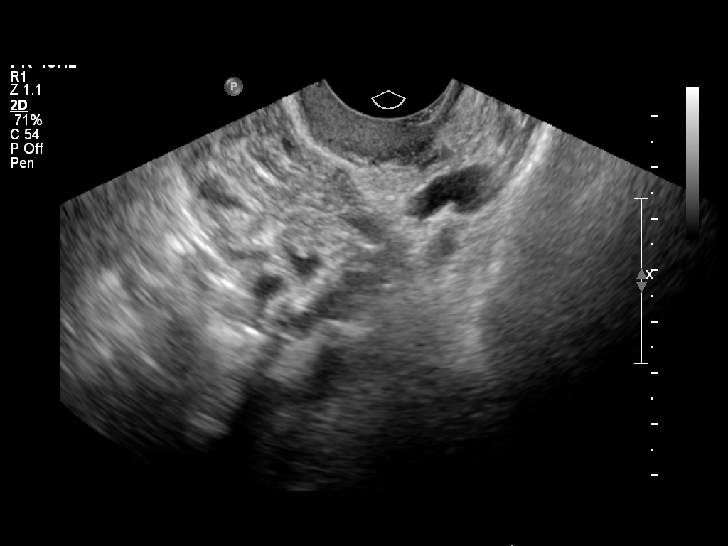
[im 45/45]
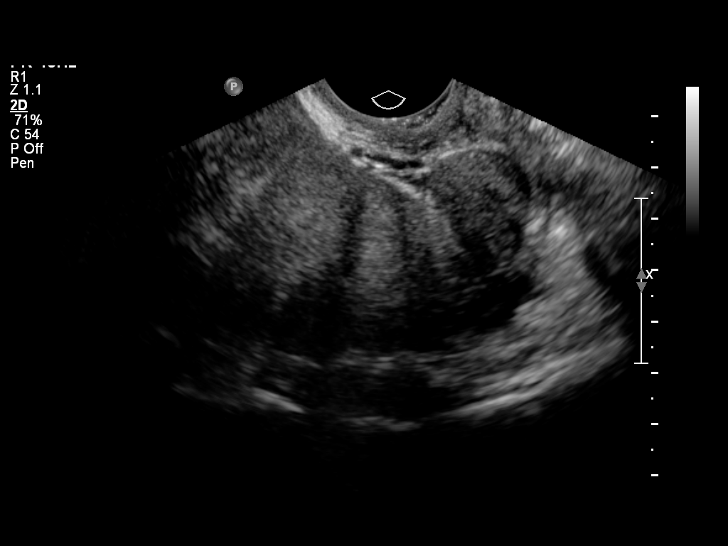

[13 of 25 positions shown; findings below may reference images not displayed]

FINDINGS: Uterus:  7.4 x 4.4 x 3.9 cm.  Right fundal predominately subserosal
mass, likely fibroid, measures 7.7 x 5.5 x 5.0 cm.  Right lateral
lower uterine segment subserosal fibroid measures 4.1 x 3.0 x
cm.  Compared to the previous exam, allowing for differences in
technique, there is no significant interval change.

Endometrium: Obscured by presumed fibroids.

Right ovary: Not visualized.  No adnexal mass.

Left ovary: Not visualized.  No adnexal mass.

Other Findings:  Small free fluid noted in the cul-de-sac.
IMPRESSION: Right lateral fundal and lower uterine segment subserosal presumed
fibroids, no significant change when compared to prior exam
allowing for differences in technique. If there has been interval
clinical enlargement, however, this may be seen with
leiomyosarcoma, which may not be able to be differentiated from
benign fibroids by imaging alone.

## 2014-07-31 ENCOUNTER — Encounter: Payer: Self-pay | Admitting: Internal Medicine
